# Patient Record
Sex: Female | Born: 1940 | Race: White | Hispanic: No
Health system: Midwestern US, Academic
[De-identification: ages and names within clinical notes are randomized; demographics above are authoritative.]

---

## 2016-12-28 ENCOUNTER — Encounter: Admit: 2016-12-28 | Discharge: 2016-12-28

## 2016-12-28 ENCOUNTER — Ambulatory Visit: Admit: 2016-12-28 | Discharge: 2016-12-29 | Payer: MEDICARE

## 2016-12-28 DIAGNOSIS — E039 Hypothyroidism, unspecified: ICD-10-CM

## 2016-12-28 DIAGNOSIS — J449 Chronic obstructive pulmonary disease, unspecified: Principal | ICD-10-CM

## 2016-12-28 DIAGNOSIS — I10 Essential (primary) hypertension: ICD-10-CM

## 2016-12-28 DIAGNOSIS — I509 Heart failure, unspecified: ICD-10-CM

## 2016-12-28 DIAGNOSIS — E785 Hyperlipidemia, unspecified: ICD-10-CM

## 2016-12-28 MED ORDER — HYDROCHLOROTHIAZIDE 25 MG PO TAB
25 mg | ORAL_TABLET | Freq: Every morning | ORAL | 3 refills | 28.00000 days | Status: AC
Start: 2016-12-28 — End: 2017-10-25

## 2016-12-28 MED ORDER — LISINOPRIL 10 MG PO TAB
ORAL_TABLET | Freq: Every evening | 3 refills | Status: AC
Start: 2016-12-28 — End: 2017-02-22

## 2016-12-28 NOTE — Progress Notes
Date of Service: 12/28/2016    Robin Carr is a 76 y.o. female.       HPI      [name] returns for follow-up of chest pain and hypertension.  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  Since I seen her last she reports one episode requiring nitro.    When I saw her last I had her reduce amlodipine from 5-2.5 mg and added hydrochlorothiazide hoping that her leg edema would resolve with reduction in the amlodipine.  She improved transiently but about a week ago had worsening of edema particularly in the right ankle.  Her diastolic blood pressure is today are little higher than usual but she thought the 150-160 reading that she had today was when she is seen in the past.  She does not check her blood pressure at home and she does not check it very often elsewhere..    When I saw her last time I was concerned about aortic stenosis.  I asked her to have an echo which was completed on March 15.  That echo showed mild aortic stenosis with no pericardial effusion and no other valve disease EF was normal at 65%.         Vitals:    12/28/16 1009 12/28/16 1047   BP: 158/82 162/84   Pulse: 61    Weight: 73 kg (161 lb)    Height: 1.499 m (4' 11)      Body mass index is 32.52 kg/m???.     Past Medical History  Patient Active Problem List    Diagnosis Date Noted   ??? Bilateral leg edema 06/18/2014     Describes a leg DVT scan negative for clots in about March 2015,  [facility]      ??? COPD (chronic obstructive pulmonary disease) (HCC) 05/14/2014   ??? Hypothyroid 05/14/2014   ??? Hyperlipemia 05/14/2014     On mevacor 10 05/20/2014 FLP: total 182, trig 87, HDL 65, LDL 108         ??? Hypertension 05/14/2014   ??? 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   06/2014: regadenoson thallium: EF 86%, normal perfusion 08/2015 Occasional chest pain mostlyw/ physical stress when emotionally stressed, nitro responsive, stable     ??? 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   HENT: Positive for tinnitus.    Eyes: Positive for photophobia.   Cardiovascular: Positive for dyspnea on exertion, leg swelling and orthopnea.   Respiratory: Positive for shortness of breath and sputum production.    Endocrine: Negative.    Hematologic/Lymphatic: Negative.    Skin: Positive for itching.   Musculoskeletal: Positive for arthritis, back pain, gout, joint pain, joint swelling and neck pain.   Gastrointestinal: Negative.    Genitourinary: Positive for bladder incontinence.   Neurological: Positive for excessive daytime sleepiness and vertigo.   Psychiatric/Behavioral: Positive for memory loss.   Allergic/Immunologic: Positive for environmental allergies.  Physical Exam  General: Patient in no distress, looks generally healthy. Skin warm and dry, non icteric,    Mucous membranes moist.  Pupils equal and round  Carotids: no bruits    Thyroid not enlarged.  Neck veins: CVP <6  no V wave, no HJR     Respiratory: Breathing comfortably. Lungs clear to percussion. and auscultation  Cardiac: Regular rhythm. LV impulse not palpable. Normal S1 & S2, Fourth heart sound, no rub or S3.  Fourth heart sound with no rub or third sound .  grade 2/6 mid to late peaking systolic murmur heard parasternal region radiating to the sternal notch and toward the clavicles but not clearly into the neck.  It is also audible at the apex.  There is no diastolic murmur.  Abdomen: soft, non-tender, no masses,bruits,hepatic or aortic enlargement. + bowel sounds. Femoral arteries: Good pulses, no bruits.  Legs/feet: Normal PT pulses,  1-2 mm bilateral ankle and low leg edema slightly greater on the right as was described on last exam from 08/2014.   Motor: Normal muscle strength. Cognitive: Pleasant demeanor. Good insight. No depression     Cardiovascular Studies  Today's 12 lead EKG: sinus rhythm, rate 61 Diffuse T wave flattening     Problems Addressed Today  No diagnosis found.    Assessment and Plan     Mrs. Himes leg swelling is probably due at least in part to the amlodipine as she has no elevation in venous pressure or other findings of volume overload.  I am going to have her stop that drug completely.  Her blood pressures higher than optimal and I am going to start her on lisinopril 10 mg at bedtime and have her increase the hydrochlorothiazide from 12.5-25 mg I will have her start the lisinopril initially at 5 mg bedtime for about a week before having her to increase to the full 10 mg.  I want her to have BMP blood test in about 2 weeks.    I will see her again in about 6-8 weeks for follow-up on her pressure.  She knows to call if things worsen.       NB: This document was prepared within the Epic(TM) EMR using templates developed by the [facility] of [facility]. It can be accessed online through [facility] feature by clinicians at other Epic institutions.  The free text in this document, was generated through Dragon(TM) software.  Editing and proofreading were done by the author of this document Dr. Mable Paris MD, [facility] principally at the point of care.  In spite of the author's best effort to identify every air introduced by voice to text dictation, some errors that may represent misspelling or misstatements of what was dictated may persist.  If there are questions about content in this document please contact Dr. Hale Bogus.    The written information I provided Ms. Bivens at the conclusion of today's encounter is as  follows: Patient Instructions   [name] I think we can improve your blood pressure control and improve your edema by making these medication changes as I am suggesting.  Stop the amlodipine.  I think a 2.5 mg is causing some of the edema but not helping that much with the blood pressure control.    Increase the hydrochlorothiazide to a full tablet 25 mg daily.  At the same time start lisinopril.  I am Unasyn to 10 mg tablet prescription to your pharmacy when she does start with 5 mg at  bedtime for the first week to get used to it and then increase to 10.  Like you to have your blood pressure checked more often summer and and see that it is running under 130/85 most of the time.  Her graph would like you to have a blood chemistry check in 2 weeks.    I like to see her back in 2 months for another blood pressure check.  The call if things are problematic before then.    It's good to see you today.   Marissa Nestle, MD               Current Medications (including today's revisions)  ??? albuterol (PROAIR HFA) 90 mcg/actuation inhaler Inhale 2 Puffs by mouth every 6 hours as needed for Wheezing.   ??? albuterol 0.5% (PROVENTIL; VENTOLIN) 2.5 mg/0.5 mL nebu nebulizer solution Inhale 2.5 mg solution as directed every 6 hours as needed.   ??? allopurinol (ZYLOPRIM) 100 mg tablet Take 100 mg by mouth twice daily.   ??? amLODIPine (NORVASC) 5 mg tablet Take 0.5 tablets by mouth daily.   ??? atorvastatin (LIPITOR) 20 mg tablet TAKE ONE TABLET BY MOUTH ONCE DAILY.   ??? FLUTICASONE/SALMETEROL (ADVAIR DISKUS IN) Inhale  by mouth into the lungs twice daily.   ??? hydroCHLOROthiazide (HYDRODIURIL) 25 mg tablet Take 0.5 tablets by mouth every morning.   ??? IPRATROPIUM/ALBUTEROL SULFATE (COMBIVENT IN) Inhale 1 puff by mouth into the lungs four times daily.   ??? levothyroxine (SYNTHROID) 75 mcg tablet Take 75 mcg by mouth daily.   ??? loratadine (CLARITIN) 10 mg tablet Take 10 mg by mouth daily. ??? meclizine (ANTIVERT) 25 mg tablet Take 25 mg by mouth as Needed.   ??? MULTIVITAMINS WITH FLUORIDE (MULTI-VITAMIN PO) Take  by mouth.   ??? nitroglycerin (NITROSTAT) 0.3 mg tablet Place 1 Tab under tongue every 5 minutes as needed for Chest Pain.   ??? ranitidine(+) (ZANTAC) 150 mg tablet Take 150 mg by mouth at bedtime daily.

## 2017-01-11 ENCOUNTER — Encounter: Admit: 2017-01-11 | Discharge: 2017-01-11 | Payer: MEDICARE

## 2017-01-11 DIAGNOSIS — I10 Essential (primary) hypertension: Principal | ICD-10-CM

## 2017-01-11 LAB — BASIC METABOLIC PANEL
Lab: 0.9
Lab: 103
Lab: 142
Lab: 29
Lab: 10
Lab: 104
Lab: 14
Lab: 15
Lab: 4.7

## 2017-02-22 ENCOUNTER — Ambulatory Visit: Admit: 2017-02-22 | Discharge: 2017-02-23 | Payer: MEDICARE

## 2017-02-22 ENCOUNTER — Encounter: Admit: 2017-02-22 | Discharge: 2017-02-22

## 2017-02-22 DIAGNOSIS — E785 Hyperlipidemia, unspecified: ICD-10-CM

## 2017-02-22 DIAGNOSIS — E039 Hypothyroidism, unspecified: ICD-10-CM

## 2017-02-22 DIAGNOSIS — I10 Essential (primary) hypertension: ICD-10-CM

## 2017-02-22 DIAGNOSIS — J449 Chronic obstructive pulmonary disease, unspecified: Principal | ICD-10-CM

## 2017-02-22 DIAGNOSIS — I509 Heart failure, unspecified: ICD-10-CM

## 2017-02-22 DIAGNOSIS — I208 Other forms of angina pectoris: Principal | ICD-10-CM

## 2017-02-22 MED ORDER — LISINOPRIL 10 MG PO TAB
10 mg | Freq: Every evening | ORAL | 0 refills | Status: AC
Start: 2017-02-22 — End: 2019-02-19

## 2017-02-22 NOTE — Progress Notes
Date of Service: 02/22/2017    Robin Carr is a 76 y.o. female.       HPI  Robin Carr returns for follow-up of her blood pressure and edema and cardiac syndrome X type chest pain about 3 months after I had her stop amlodipine because of edema and increase lisinopril and hydrochlorothiazide for better blood pressure control.  Since I seen her last she has done well she had one episode of chest pain that was associated with overeating and some GI distress.  She has not had any other chest pain with exertion that sounds like syndrome X cardiac type angina.  Her blood pressure log shows blood pressure readings down to around 100 and up into the 140s but no 150s with many readings in the 110-130/85 range.  Her main functional limitation is knee pain.  Sometimes she thinks her leg swelled because of knee inflammation.  She is not having palpitations syncope or near syncope.  Overall things seem stable for her.    Her last lipid profile was less than a year ago and looked fine on atorvastatin   03/12/2016 00:00   Cholesterol 143 (L)   Triglycerides 144   HDL 49   LDL 78     Her lab work last month looked good with no diuretic related  hypokalemia.   01/11/2017 00:00   Sodium 142   Potassium 4.7   Chloride 104   CO2 29.0   Anion Gap 14   Blood Urea Nitrogen 15.0   Creatinine 0.96   Glucose 103   Calcium 10.0                 Vitals:    02/22/17 0912 02/22/17 0928   BP: 140/86 134/76   Pulse: 58    Weight: 70.8 kg (156 lb)    Height: 1.499 m (4' 11)      Body mass index is 31.51 kg/m???.     Past Medical History  Patient Active Problem List    Diagnosis Date Noted   ??? Bilateral leg edema 06/18/2014     Describes a leg DVT scan negative for clots in about March 2015,  [facility]      ??? COPD (chronic obstructive pulmonary disease) (HCC) 05/14/2014   ??? Hypothyroid 05/14/2014   ??? Hyperlipemia 05/14/2014     On mevacor 10 05/20/2014 FLP: total 182, trig 87, HDL 65, LDL 108         ??? Hypertension 05/14/2014 ??? 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   06/2014: regadenoson thallium: EF 86%, normal perfusion  08/2015 Occasional chest pain mostlyw/ physical stress when emotionally stressed, nitro responsive, stable     ??? 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   HENT: Positive for congestion and tinnitus.    Eyes: Positive for photophobia.   Cardiovascular: Positive for chest pain, dyspnea on exertion, leg swelling and orthopnea.   Respiratory: Positive for shortness of breath and sputum production.    Endocrine: Negative.  Hematologic/Lymphatic: Negative.    Skin: Negative.    Musculoskeletal: Positive for arthritis, back pain, gout, joint pain, joint swelling, muscle cramps, neck pain and stiffness.   Gastrointestinal: Positive for heartburn.   Genitourinary: Positive for bladder incontinence and urgency.   Neurological: Positive for excessive daytime sleepiness, focal weakness, headaches, paresthesias and vertigo.   Psychiatric/Behavioral: Positive for memory loss.   Allergic/Immunologic: Positive for environmental allergies.   14 organ system review noted. It is negative except as reported in current narrative or above in the ROS section. This is a patient centered review of systems that was stated by the patient in her terms prior to my personal problem oriented interview with the patient     Physical Exam  General: Patient in no distress, looks generally healthy. Skin warm and dry, non icteric,    Mucous membranes moist.  Pupils equal and round  Carotids: no bruits Thyroid not enlarged.  Neck veins: CVP <6  no V wave, no HJR     Respiratory: Breathing comfortably. Lungs clear to percussion. and auscultation  Cardiac: Regular rhythm. LV impulse not palpable. Normal S1 & S2, Fourth heart sound, no rub or S3.  Fourth heart sound with no rub or third sound .  grade 2/6 mid to late peaking systolic murmur heard parasternal region radiating to the sternal notch and toward the clavicles but not clearly into the neck.  It is also audible at the apex.  There is no diastolic murmur.  Abdomen: soft, non-tender, no masses,bruits,hepatic or aortic enlargement. + bowel sounds.   Femoral arteries: Good pulses, no bruits.  Legs/feet: Normal PT pulses,  1-2 mm bilateral ankle and low leg edema slightly greater on the right   Motor: Normal muscle strength. Cognitive: Pleasant demeanor. Good insight. No depression     Cardiovascular Studies  EKG today shows sinus rhythm leftward axis minor ST-T changes with 1 mm T-wave inversions leads V2 through V4 and lead III.Marland Kitchen    Problems Addressed Today  No diagnosis found.    Assessment and Plan     Overall Mrs. Robin Carr is doing well.  I see no reason to change any of her minute any of her medications.  She has a little edema on hydrochlorothiazide but I do not think this is a reason to modify her medications.  Her edema is not bothering her    She has lost a little weight.  Her blood pressure looks fine with a 20 point reduction in reading in the office compared to her last visit.  I do not think it is prudent to try to augment her medications from here given the fact that she has a low range blood pressure approaching 100.    Her murmur of aortic outflow obstruction is consistent only with mild to moderate aortic stenosis/sclerosis.  There is no indication for a follow-up echo at this time.  I think I can see her again in a year sooner if the need arises.      NB: This document was prepared within the Epic(TM) EMR using templates developed by the [facility] of [facility]. It can be accessed online through [facility] feature by clinicians at other Epic institutions.  The free text in this document, was generated through Dragon(TM) software.  Editing and proofreading were done by the author of this document Dr. Mable Paris MD, [facility] principally at the point of care.  In spite of the author's best effort to identify every error introduced by voice to  text dictation, some errors that may represent misspelling or misstatements of what was dictated may persist.  If there are questions about content in this document please contact Dr. Hale Bogus.    The written information I provided Ms. Tardiff at the conclusion of today's encounter is as  follows:    Patient Instructions   Things look much better today than they did last visit when I had to change  your blood pressure medications.  Your blood pressure is 20 points less in clinic today than on last visit.  Your readings at home look good.  I do not think we can increase the medication safely to reduce the slightly elevated readings because you might drop too low at other times and be hurt.    Call if you get more chest pain.  Right now I am very pleased to see that you had only one episode in the past 3 months.    I really do not think I need to see you again for a year unless something changes.  It would be okay with me if you returned at 6 months but that is going to be in February which is probably the worst time of year to be finding her way to the office.  If you want to a scheduled for a little later in the spring and still see me sooner than a year that would be fine also.    Call if you develop chest pain or pressure with exercise or unexplained shortness of air when you are active.   It's good to see you today.   Marissa Nestle, MD                Current Medications (including today's revisions)  ??? albuterol (PROAIR HFA) 90 mcg/actuation inhaler Inhale 2 Puffs by mouth every 6 hours as needed for Wheezing.   ??? albuterol 0.5% (PROVENTIL; VENTOLIN) 2.5 mg/0.5 mL nebu nebulizer solution Inhale 2.5 mg solution as directed every 6 hours as needed.   ??? allopurinol (ZYLOPRIM) 100 mg tablet Take 100 mg by mouth twice daily.   ??? atorvastatin (LIPITOR) 20 mg tablet TAKE ONE TABLET BY MOUTH ONCE DAILY.   ??? FLUTICASONE/SALMETEROL (ADVAIR DISKUS IN) Inhale  by mouth into the lungs twice daily.   ??? hydroCHLOROthiazide (HYDRODIURIL) 25 mg tablet Take 1 tablet by mouth every morning.   ??? levothyroxine (SYNTHROID) 75 mcg tablet Take 75 mcg by mouth daily.   ??? lisinopril (PRINIVIL; ZESTRIL) 10 mg tablet Take 1 tab HS after one week at 1/2 tab bedtime (Patient taking differently: Take 10 mg by mouth at bedtime daily. Take 1 tab HS)   ??? loratadine (CLARITIN) 10 mg tablet Take 10 mg by mouth daily.   ??? meclizine (ANTIVERT) 25 mg tablet Take 25 mg by mouth as Needed.   ??? MULTIVITAMINS WITH FLUORIDE (MULTI-VITAMIN PO) Take  by mouth.   ??? nitroglycerin (NITROSTAT) 0.3 mg tablet Place 1 Tab under tongue every 5 minutes as needed for Chest Pain.   ??? ranitidine(+) (ZANTAC) 150 mg tablet Take 150 mg by mouth at bedtime daily.

## 2017-06-19 ENCOUNTER — Encounter: Admit: 2017-06-19 | Discharge: 2017-06-19 | Payer: MEDICARE

## 2017-06-19 DIAGNOSIS — I10 Essential (primary) hypertension: Principal | ICD-10-CM

## 2017-06-19 DIAGNOSIS — E785 Hyperlipidemia, unspecified: ICD-10-CM

## 2017-06-20 MED ORDER — ATORVASTATIN 20 MG PO TAB
ORAL_TABLET | Freq: Every day | 3 refills | Status: AC
Start: 2017-06-20 — End: 2018-06-20

## 2017-08-10 LAB — COMPREHENSIVE METABOLIC PANEL
Lab: 1
Lab: 1.1
Lab: 105
Lab: 143
Lab: 17 — ABNORMAL HIGH (ref 0–14)
Lab: 35 — ABNORMAL HIGH (ref 5–34)
Lab: 4
Lab: 40
Lab: 5.1
Lab: 7.6
Lab: 82

## 2017-08-10 LAB — LIPID PROFILE
Lab: 147
Lab: 53
Lab: 73

## 2017-08-11 ENCOUNTER — Encounter: Admit: 2017-08-11 | Discharge: 2017-08-11 | Payer: MEDICARE

## 2017-08-11 DIAGNOSIS — I10 Essential (primary) hypertension: Principal | ICD-10-CM

## 2017-08-11 DIAGNOSIS — E785 Hyperlipidemia, unspecified: ICD-10-CM

## 2017-10-25 ENCOUNTER — Ambulatory Visit: Admit: 2017-10-25 | Discharge: 2017-10-26 | Payer: MEDICARE

## 2017-10-25 ENCOUNTER — Encounter: Admit: 2017-10-25 | Discharge: 2017-10-25 | Payer: Medicare Other

## 2017-10-25 DIAGNOSIS — I10 Essential (primary) hypertension: ICD-10-CM

## 2017-10-25 DIAGNOSIS — I208 Other forms of angina pectoris: ICD-10-CM

## 2017-10-25 DIAGNOSIS — I358 Other nonrheumatic aortic valve disorders: ICD-10-CM

## 2017-10-25 DIAGNOSIS — K635 Polyp of colon: ICD-10-CM

## 2017-10-25 DIAGNOSIS — E785 Hyperlipidemia, unspecified: ICD-10-CM

## 2017-10-25 DIAGNOSIS — E039 Hypothyroidism, unspecified: ICD-10-CM

## 2017-10-25 DIAGNOSIS — I509 Heart failure, unspecified: ICD-10-CM

## 2017-10-25 DIAGNOSIS — R600 Localized edema: Principal | ICD-10-CM

## 2017-10-25 DIAGNOSIS — E875 Hyperkalemia: ICD-10-CM

## 2017-10-25 DIAGNOSIS — E7800 Pure hypercholesterolemia, unspecified: ICD-10-CM

## 2017-10-25 DIAGNOSIS — J449 Chronic obstructive pulmonary disease, unspecified: Principal | ICD-10-CM

## 2017-10-25 MED ORDER — CHLORTHALIDONE 25 MG PO TAB
25 mg | ORAL_TABLET | Freq: Every day | ORAL | 3 refills | Status: AC
Start: 2017-10-25 — End: 2019-03-26

## 2017-12-23 ENCOUNTER — Encounter: Admit: 2017-12-23 | Discharge: 2017-12-23 | Payer: MEDICARE

## 2017-12-26 MED ORDER — LISINOPRIL 10 MG PO TAB
10 mg | ORAL_TABLET | Freq: Every evening | ORAL | 3 refills | Status: AC
Start: 2017-12-26 — End: 2018-12-18

## 2017-12-30 LAB — BASIC METABOLIC PANEL
Lab: 0.9
Lab: 14
Lab: 15
Lab: 58 — ABNORMAL LOW (ref >59)
Lab: 9.8
Lab: 99

## 2018-03-13 IMAGING — MG MAMMOGRAM 3D SCREEN, BILATERAL
10 of 15 series · 10 of 15 positions shown · non-contrast
Comparison: none

[R CC (1 of 2)]
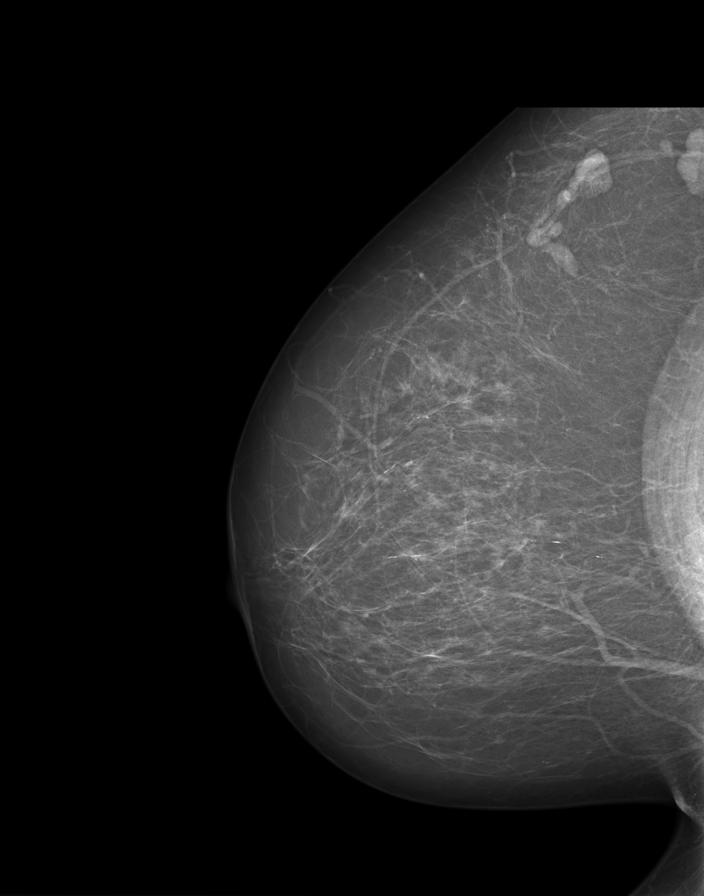

[R tomo (1 of 2)]
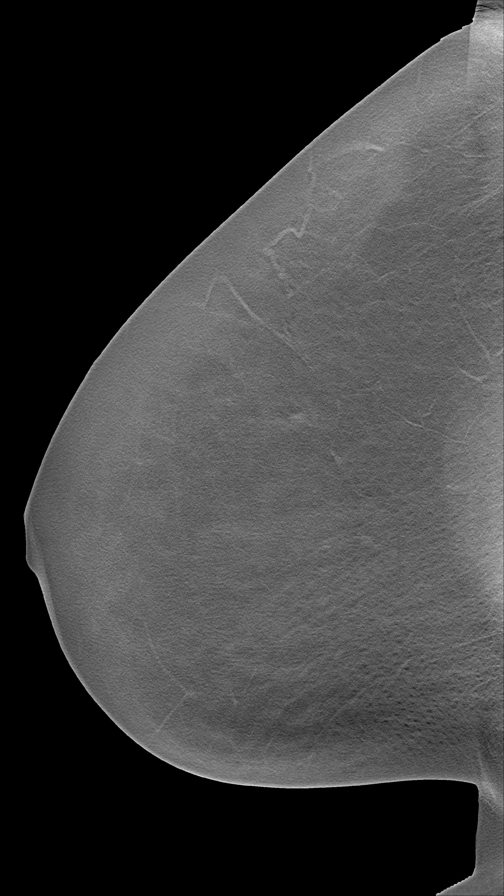

[R CC (2 of 2)]
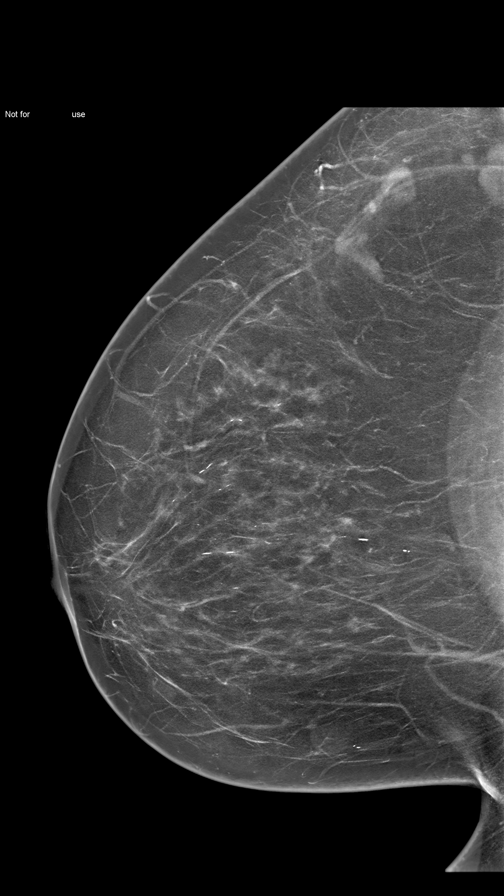

[R (1 of 2)]
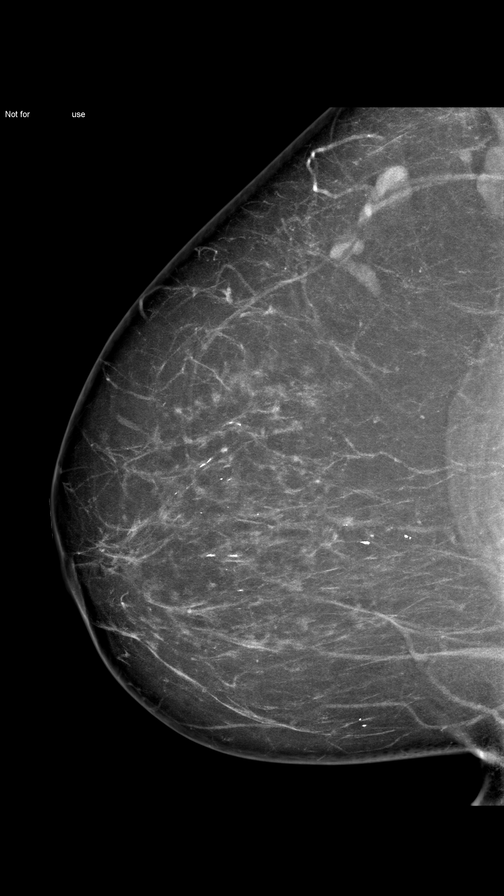

[L tomo]
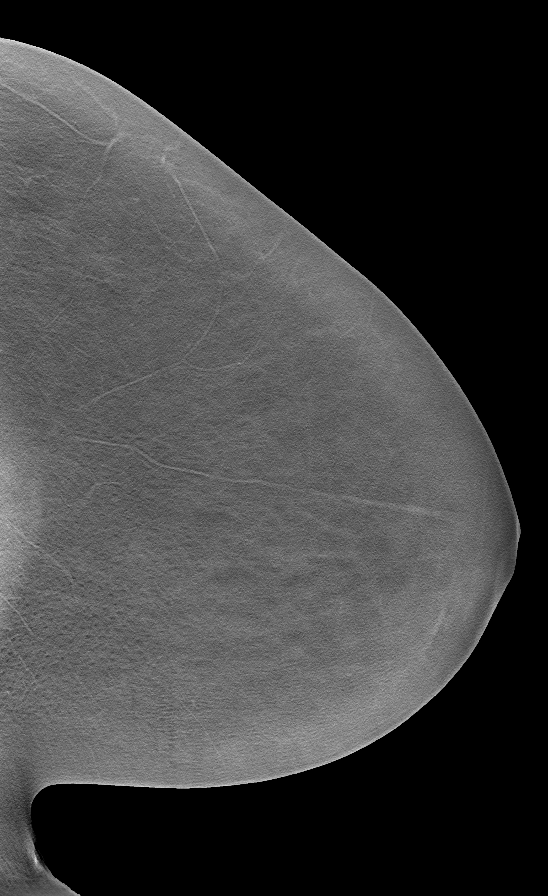

[L]
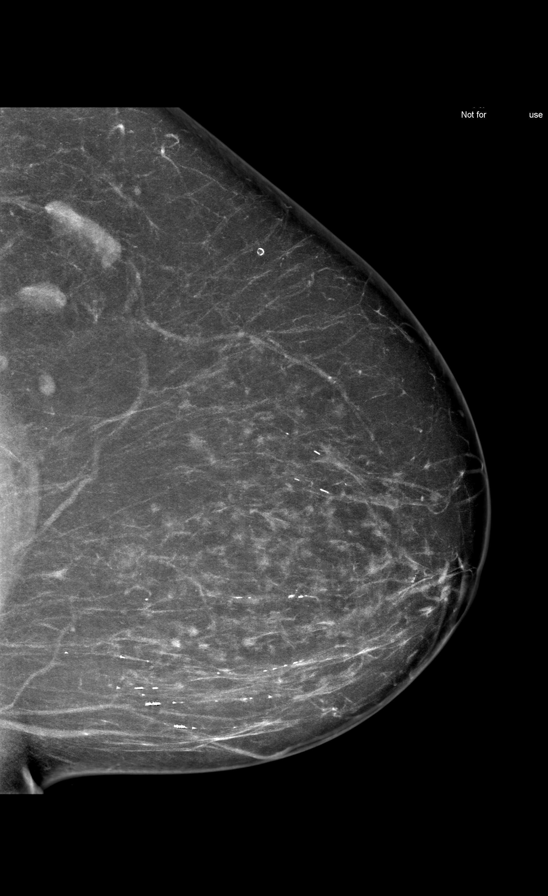

[R MLO (1 of 2)]
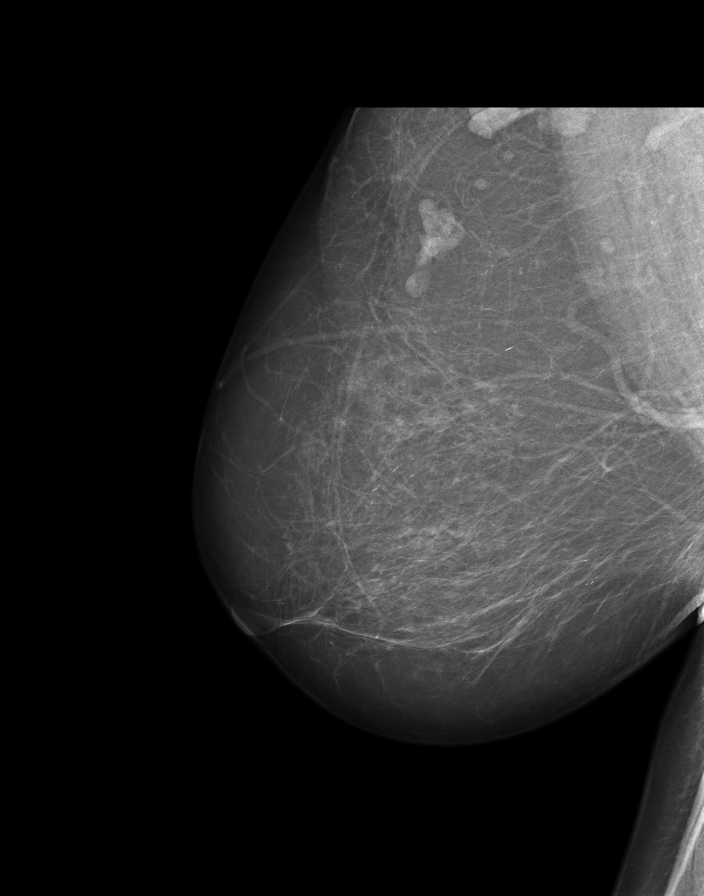

[R tomo (2 of 2)]
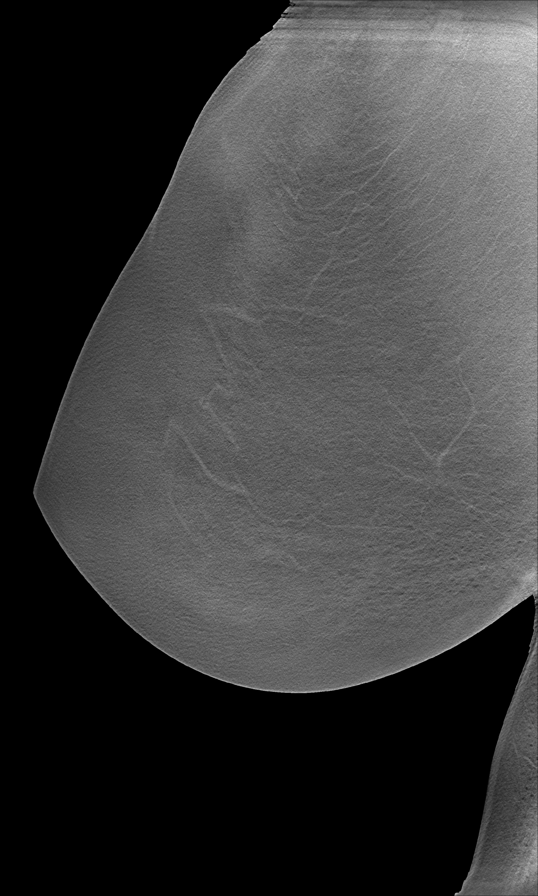

[R MLO (2 of 2)]
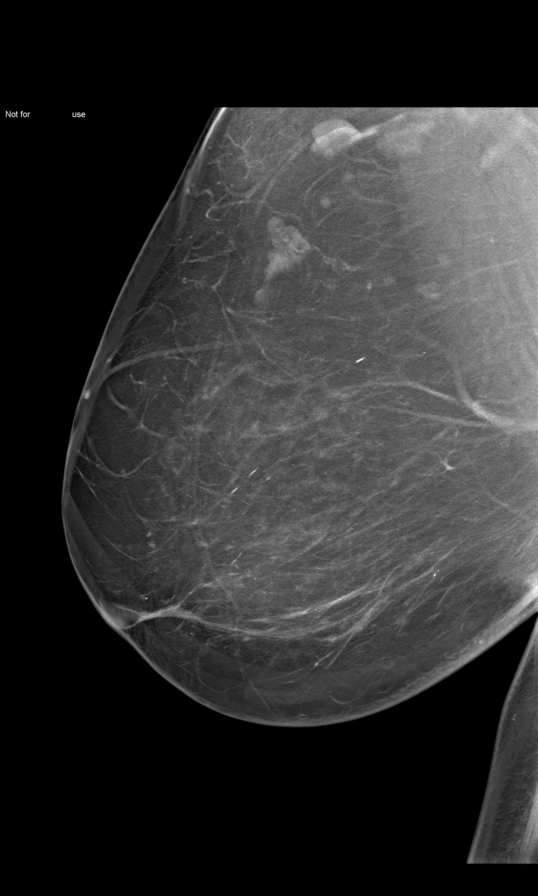

[R (2 of 2)]
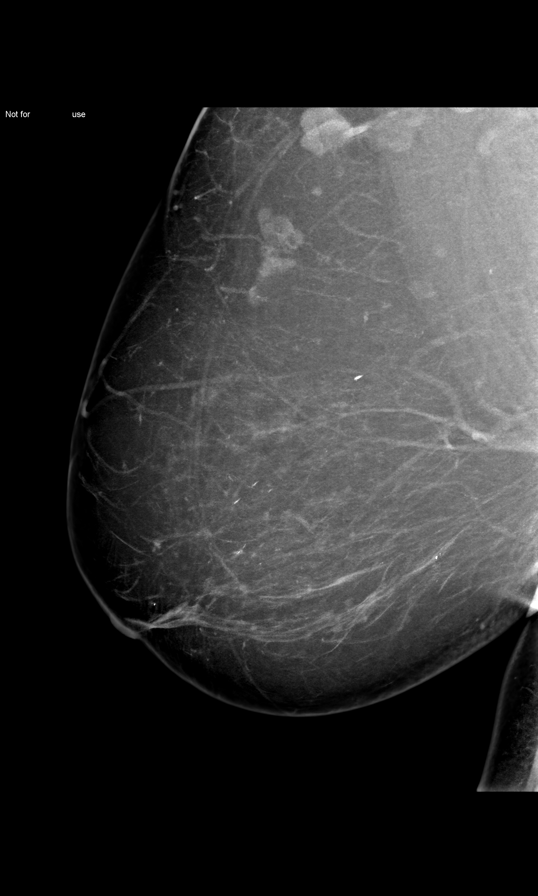

[10 of 15 positions shown; findings below may reference images not displayed]

DIAGNOSTIC STUDIES

EXAM

Screening mammogram

INDICATION

screening bilateral
SCREENING. AB (3D) PRIORS: 9112- FILMS ARE ON SITE.

TECHNIQUE

Digital craniocaudal and mediolateral oblique images of the bilateral breasts were obtained and
reviewed with computer-aided detection.

COMPARISONS

Mammograms dated October 26, 2002

FINDINGS

The breasts demonstrate a scattered fibroglandular pattern. There are normal secretory
calcifications again appreciated. There are scattered axillary lymph nodes which are unchanged
bilaterally. There is no new suspicious calcification, architectural distortion or dominant mass.
There is no nipple retraction or skin thickening.

IMPRESSION

BI-RADS 2 benign findings

Yearly screening mammogram is recommended and a follow-up letter will be scheduled

## 2018-03-28 ENCOUNTER — Encounter: Admit: 2018-03-28 | Discharge: 2018-03-28 | Payer: MEDICARE

## 2018-04-11 ENCOUNTER — Ambulatory Visit: Admit: 2018-04-11 | Discharge: 2018-04-12 | Payer: MEDICARE

## 2018-04-11 ENCOUNTER — Encounter: Admit: 2018-04-11 | Discharge: 2018-04-11 | Payer: MEDICARE

## 2018-04-11 DIAGNOSIS — R600 Localized edema: ICD-10-CM

## 2018-04-11 DIAGNOSIS — J449 Chronic obstructive pulmonary disease, unspecified: Principal | ICD-10-CM

## 2018-04-11 DIAGNOSIS — E039 Hypothyroidism, unspecified: ICD-10-CM

## 2018-04-11 DIAGNOSIS — E785 Hyperlipidemia, unspecified: ICD-10-CM

## 2018-04-11 DIAGNOSIS — E7800 Pure hypercholesterolemia, unspecified: ICD-10-CM

## 2018-04-11 DIAGNOSIS — I10 Essential (primary) hypertension: ICD-10-CM

## 2018-04-11 DIAGNOSIS — I509 Heart failure, unspecified: ICD-10-CM

## 2018-04-11 DIAGNOSIS — I208 Other forms of angina pectoris: Principal | ICD-10-CM

## 2018-04-11 DIAGNOSIS — I358 Other nonrheumatic aortic valve disorders: ICD-10-CM

## 2018-06-20 ENCOUNTER — Encounter: Admit: 2018-06-20 | Discharge: 2018-06-20 | Payer: MEDICARE

## 2018-06-20 MED ORDER — ATORVASTATIN 20 MG PO TAB
ORAL_TABLET | Freq: Every day | 0 refills | Status: AC
Start: 2018-06-20 — End: 2018-09-20

## 2018-09-17 ENCOUNTER — Encounter: Admit: 2018-09-17 | Discharge: 2018-09-17 | Payer: Medicare Other

## 2018-09-20 MED ORDER — ATORVASTATIN 20 MG PO TAB
ORAL_TABLET | Freq: Every day | 3 refills | Status: AC
Start: 2018-09-20 — End: 2019-09-10

## 2018-11-13 IMAGING — CR CHEST
1 series · 1 of 1 positions shown · non-contrast
Comparison: none

[chest port x-wise]
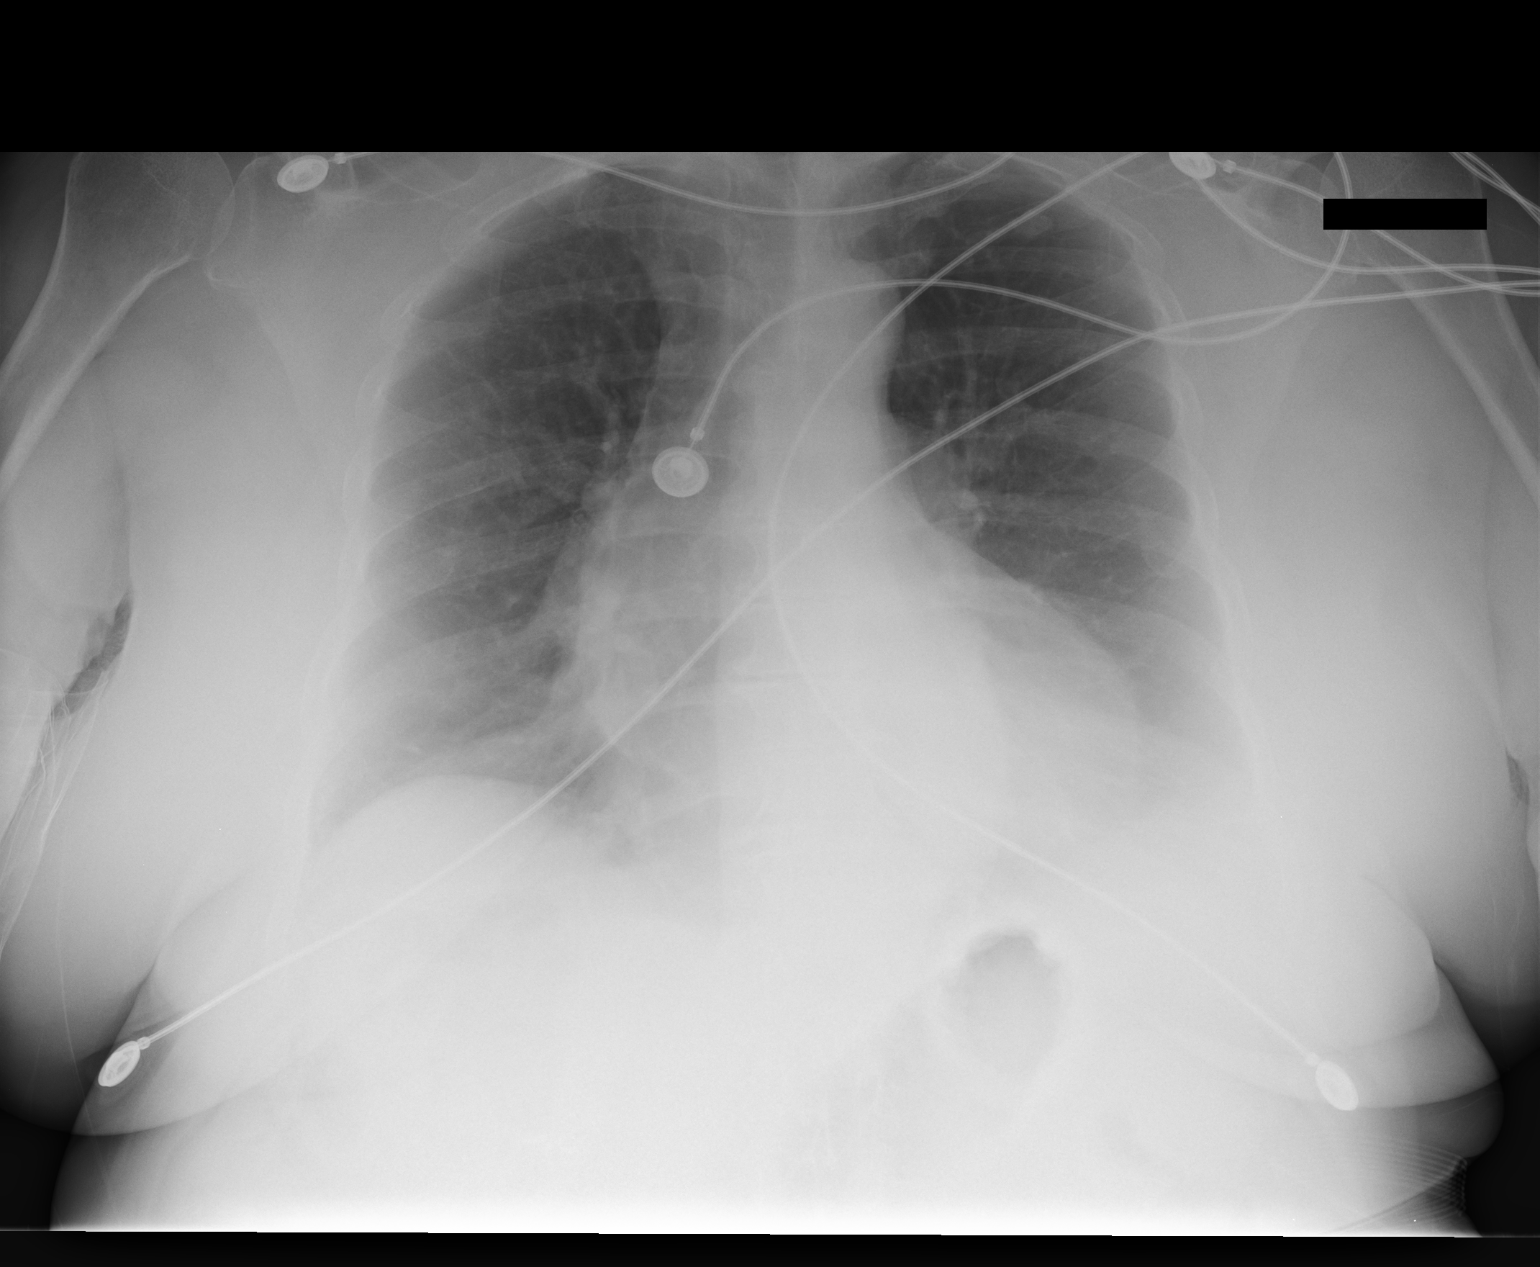

[1 of 1 positions shown; findings below may reference images not displayed]

DIAGNOSTIC STUDIES

EXAM

Chest radiograph.

INDICATION

cp
PT C/O CHEST PAIN. SOA. TJ

TECHNIQUE

AP chest.

COMPARISONS

June 06, 2017.

FINDINGS

The cardiomediastinal silhouette is not significantly changed. Mildly thickened interstitial
markings. There is no compelling evidence of dense consolidation, effusion, or pneumothorax. Mild
pulmonary hyperinflation.

IMPRESSION

No compelling evidence of significant change from prior exam.

Tech Notes:

PT C/O CHEST PAIN. SOA. TJ

## 2018-12-18 ENCOUNTER — Encounter: Admit: 2018-12-18 | Discharge: 2018-12-18

## 2018-12-18 MED ORDER — LISINOPRIL 10 MG PO TAB
ORAL_TABLET | Freq: Every day | 0 refills | Status: DC
Start: 2018-12-18 — End: 2019-02-19

## 2019-02-19 ENCOUNTER — Encounter: Admit: 2019-02-19 | Discharge: 2019-02-19 | Payer: MEDICARE

## 2019-02-19 MED ORDER — LISINOPRIL 10 MG PO TAB
ORAL_TABLET | Freq: Every day | 0 refills | Status: DC
Start: 2019-02-19 — End: 2019-06-18

## 2019-03-24 ENCOUNTER — Encounter: Admit: 2019-03-24 | Discharge: 2019-03-24 | Payer: MEDICARE

## 2019-03-26 MED ORDER — CHLORTHALIDONE 25 MG PO TAB
ORAL_TABLET | Freq: Every day | ORAL | 1 refills | 90.00000 days | Status: DC
Start: 2019-03-26 — End: 2019-09-17

## 2019-04-26 ENCOUNTER — Encounter: Admit: 2019-04-26 | Discharge: 2019-04-26 | Payer: MEDICARE

## 2019-05-01 ENCOUNTER — Encounter: Admit: 2019-05-01 | Discharge: 2019-05-01 | Payer: MEDICARE

## 2019-05-01 DIAGNOSIS — J449 Chronic obstructive pulmonary disease, unspecified: Secondary | ICD-10-CM

## 2019-05-01 DIAGNOSIS — E785 Hyperlipidemia, unspecified: Secondary | ICD-10-CM

## 2019-05-01 DIAGNOSIS — I10 Essential (primary) hypertension: Secondary | ICD-10-CM

## 2019-05-01 DIAGNOSIS — I358 Other nonrheumatic aortic valve disorders: Secondary | ICD-10-CM

## 2019-05-01 DIAGNOSIS — E7800 Pure hypercholesterolemia, unspecified: Secondary | ICD-10-CM

## 2019-05-01 DIAGNOSIS — E039 Hypothyroidism, unspecified: Secondary | ICD-10-CM

## 2019-05-01 DIAGNOSIS — I509 Heart failure, unspecified: Secondary | ICD-10-CM

## 2019-05-01 DIAGNOSIS — I208 Other forms of angina pectoris: Secondary | ICD-10-CM

## 2019-05-01 NOTE — Progress Notes
Date of Service: 05/01/2019    Robin Carr is a 78 y.o. female.       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  She now tells me that her limiting factor in activity is knee pain.  She is not having any exertional chest pain.  In fact  denies having typical symptoms suggesting the presence of angina, heart failure, TIA, claudication or arrhythmias.      Her last ER visit was early last year when she had atypical chest pain tghat was troponin negative. She has occasional chest pain but nothing progressive or limiting her activities of daily living.     She has had knee pain to the point where she has been considering knee replacement.  Her pulmonologist Dr. Lucilla Lame has advised her against knee replacement if at all possible because of pulmonary risks.    When I inquired about  her tolerance to atorvastatin she states that she has some joint stiffness that is worse in the morning and diminishes as the day goes by.  I reassured her that this is not characteristic of statin discomfort.    Blood pressure when checked looks good.  She is not checking it at home.  Today's readings are very satisfactory.         Vitals:    05/01/19 0912 05/01/19 0918   BP: 128/76 132/78   BP Source: Arm, Left Upper Arm, Right Upper   Pulse: 74    Temp: 37.1 ?C (98.8 ?F)    SpO2: 96%    Weight: 71.7 kg (158 lb)    Height: 1.473 m (4' 10)    PainSc: Zero      Body mass index is 33.02 kg/m?Marland Kitchen     Past Medical History  Patient Active Problem List    Diagnosis Date Noted   ? 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 Hyperkalemia 10/25/2017     K 5.1 on HCTZ, switch to Chlorthalidone 25 10/25/17     ? Bilateral leg edema 06/18/2014     Describes a leg DVT scan negative for clots in about March 2015, [facility]   2018 Edema improves with DC amlodipine       ? COPD (chronic obstructive pulmonary disease) (HCC) 05/14/2014   ? Hypothyroid 05/14/2014   ? Hyperlipemia 05/14/2014     On mevacor 10 05/20/2014 FLP: total 182, trig 87, HDL 65, LDL 108    08/10/2017 total cholesterol 154, triglyceride 147, HDL 53, LDL 73 on Lipitor 20 without myalgias         ? Hypertension 05/14/2014   ? 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   06/2014: regadenoson thallium: EF 86%, normal perfusion  08/2015 Occasional chest pain mostlyw/ physical stress when emotionally stressed, nitro responsive, stable  10/2016 off amlodipine due to edema patient having no exertional chest pain with nitroglycerin at home but not used recently.     ? 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   HENT: Negative.    Eyes: Negative.    Cardiovascular: Negative.    Respiratory: Negative.    Endocrine: Negative.    Hematologic/Lymphatic: Negative.    Skin: Negative.    Musculoskeletal: Positive for joint pain and joint swelling.   Gastrointestinal: Negative.    Genitourinary: Positive for bladder incontinence.   Neurological: Positive for vertigo.   Psychiatric/Behavioral: Negative.    Allergic/Immunologic: Negative.        Physical Exam  General: Patient in no distress, looks generally healthy. Skin warm and dry, non icteric, ?  Mucous membranes moist. ?Pupils equal and round ?Carotids: no bruits ???Thyroid not enlarged.  Neck veins: CVP <6 ?no V wave, no HJR ??  Respiratory: Breathing comfortably. Lungs clear to percussion. and auscultation  Cardiac: Regular rhythm. LV impulse not palpable. Normal S1 &?S2, Fourth heart sound, no rub or S3. ?Fourth heart sound with no rub or third sound . ?Grade 2/6 mid to late peaking systolic murmur heard parasternal region  with no diastolic murmur.  Abdomen: soft, non-tender, no masses,bruits,hepatic or aortic enlargement. + bowel sounds.   Femoral arteries: Good pulses, no bruits. ?Legs/feet: Normal PT pulses, ?2-3 mm bilateral ankle and low to mid leg edema slightly greater on the right   Motor: Normal muscle strength. Cognitive: Pleasant demeanor. Good insight. No depression     Cardiovascular Studies  Today's 12 lead EKG: sinus rhythm, rate 76.  Slight T wave inversion seen on EKGs in 2018 has resolved.  No change in tracing from last year.  Her lab work from this summer looks good with a normal renal function and potassium and minimally out of range sodium with LDL under 70 and HDL nearly 50.  The TSH is normal as well.   01/24/2019 00:00   Sodium 134 (L)   Potassium 4.3   Chloride 100   CO2 26.0   Anion Gap 12   Blood Urea Nitrogen 11.0   Creatinine 1.01   eGFR Non African American 56.6 (L)   Glucose 102   Albumin 3.9   Calcium 9.9   Total Bilirubin 0.95   Total Protein 7.0   AST (SGOT) 28   ALT (SGPT) 37   Alk Phosphatase 78   TSH 3.02   Cholesterol 137   Triglycerides 98   HDL 48   LDL 69     Problems Addressed Today  No diagnosis found.    Assessment and Plan     Robin Carr is doing extremely well.  She is not having any chest pain that suggests coronary etiology.  Her musculoskeletal complaints are unlikely the cause of the stiffness in the morning rather than that being from statins.  Blood pressure looks good.  Her LDL is under 70 on atorvastatin.  There is no reason to consider changing any of her cardiac medications.    I endorsed Dr. Lorrin Mais recommendation for her to start low-dose aspirin his primary prophylaxis against cardiovascular events.  She is never had any vascular complications that would call for aspirin but I think at her age with her overall risk factors that aspirin is prudent.  She is doing well without bleeding complications.    Her lab echo showed aortic sclerosis.  Her exam is unchanged.  I do not think we need to repeat an echo Doppler at  any specific time in the future but  I will consider it when I see her again next year.    I do not think I need to see her more often than annually.      NB: The free text in this document was generated through Dragon(TM) software with editing and proofreading  done by the author of this document Dr. Mable Paris MD, [facility] principally at the point of care. Some errors may persist.  If there are questions about content in this document please contact Dr. Hale Bogus.    The written information I provided Robin Carr at the conclusion of today's encounter is as  follows:    Patient Instructions   Overall things are going very well for you.  Your blood pressure looks good.  Your rhythm is regular on EKG.  Your heart murmur is consistent with a minor thickening of the aortic valve without major blockage.  That is what we saw on the echo Doppler.  I do not think you need another echo at any specific time in the future.  I had like to see you again next year and we can think about getting an echo at that time if anything is changed.    In the meantime if you get more chest pain or shortness of breath with exertion let us know and we will reevaluate that at the time.  Your medications for cholesterol reduction are successful which is good news.  Your atorvastatin dose is only 20 mg which is one fourth the maximum.    You don't need a stress test without symptoms. If you start having shortness of air or chest discomfort with exercise, call in so we can decide about a stress test or other evaluation.    Return to see me for a recheck in one year.   I can see you sooner if needed.   Call in if you have problems or questions. Marissa Nestle, MD                Current Medications (including today's revisions)  ? aclidinium bromide(+) (TUDORZA PRESSAIR) 400 mcg/actuation inhaler   1 Puff(s), BID, INH, 3 EA, 340b plan  replaces spiriva, 3 Number of Refills, 3, Route to Pharmacy Electronically, [facility]   ? albuterol (PROAIR HFA) 90 mcg/actuation inhaler Inhale 2 Puffs by mouth every 6 hours as needed for Wheezing.   ? albuterol 0.5% (PROVENTIL; VENTOLIN) 2.5 mg/0.5 mL nebu nebulizer solution Inhale 2.5 mg solution as directed every 6 hours as needed.   ? allopurinol (ZYLOPRIM) 100 mg tablet Take 100 mg by mouth twice daily.   ? aspirin EC 81 mg tablet Take 81 mg by mouth daily. Take with food.   ? atorvastatin (LIPITOR) 20 mg tablet Take 1 tablet by mouth once daily   ? calcium carbonate/vitamin D2 (CALCIUM + VITAMIN D PO) Take  by mouth.   ? chlorthalidone (HYGROTON) 25 mg tablet Take 1 tablet by mouth once daily (Patient taking differently: Take 12.5 mg by mouth daily.)   ? Cholecalciferol (Vitamin D3) 2,000 unit cap Take 1 tablet by mouth daily.   ? FLUTICASONE/SALMETEROL (ADVAIR DISKUS IN) Inhale  by mouth into the lungs twice daily.   ? levothyroxine (SYNTHROID) 75 mcg tablet Take 75 mcg by mouth daily.   ? lisinopriL (ZESTRIL) 10 mg tablet TAKE 1 TABLET BY MOUTH ONCE DAILY AT BEDTIME   ? loratadine (CLARITIN) 10 mg tablet Take 10 mg by mouth daily.   ?  meclizine (ANTIVERT) 25 mg tablet Take 25 mg by mouth as Needed.   ? MULTIVITAMINS WITH FLUORIDE (MULTI-VITAMIN PO) Take  by mouth.   ? nitroglycerin (NITROSTAT) 0.3 mg tablet Place 1 Tab under tongue every 5 minutes as needed for Chest Pain.   ? omeprazole DR (PRILOSEC) 40 mg capsule Take 40 mg by mouth daily before breakfast.   ? ranitidine(+) (ZANTAC) 150 mg tablet Take 150 mg by mouth at bedtime daily.   ? tiotropium (SPIRIVA WITH HANDIHALER) 18 mcg capsule for inhaler Place 18 mcg into inhaler and inhale into lungs as directed daily.

## 2019-05-01 NOTE — Patient Instructions
Overall things are going very well for you.  Your blood pressure looks good.  Your rhythm is regular on EKG.  Your heart murmur is consistent with a minor thickening of the aortic valve without major blockage.  That is what we saw on the echo Doppler.  I do not think you need another echo at any specific time in the future.  I had like to see you again next year and we can think about getting an echo at that time if anything is changed.    In the meantime if you get more chest pain or shortness of breath with exertion let us know and we will reevaluate that at the time.  Your medications for cholesterol reduction are successful which is good news.  Your atorvastatin dose is only 20 mg which is one fourth the maximum.    You don't need a stress test without symptoms. If you start having shortness of air or chest discomfort with exercise, call in so we can decide about a stress test or other evaluation.    Return to see me for a recheck in one year.   I can see you sooner if needed.   Call in if you have problems or questions.   Lenoard Aden, MD

## 2019-06-18 ENCOUNTER — Encounter: Admit: 2019-06-18 | Discharge: 2019-06-18 | Payer: MEDICARE

## 2019-06-18 MED ORDER — LISINOPRIL 10 MG PO TAB
ORAL_TABLET | Freq: Every day | 3 refills | Status: AC
Start: 2019-06-18 — End: ?

## 2019-09-07 ENCOUNTER — Encounter: Admit: 2019-09-07 | Discharge: 2019-09-07 | Payer: MEDICARE

## 2019-09-10 MED ORDER — ATORVASTATIN 20 MG PO TAB
ORAL_TABLET | Freq: Every day | 0 refills | Status: DC
Start: 2019-09-10 — End: 2019-12-05

## 2019-09-16 ENCOUNTER — Encounter: Admit: 2019-09-16 | Discharge: 2019-09-16 | Payer: MEDICARE

## 2019-09-17 MED ORDER — CHLORTHALIDONE 25 MG PO TAB
ORAL_TABLET | Freq: Every day | ORAL | 3 refills | 90.00000 days | Status: AC
Start: 2019-09-17 — End: ?

## 2019-12-05 ENCOUNTER — Encounter: Admit: 2019-12-05 | Discharge: 2019-12-05 | Payer: MEDICARE

## 2019-12-05 DIAGNOSIS — E7800 Pure hypercholesterolemia, unspecified: Secondary | ICD-10-CM

## 2019-12-05 MED ORDER — ATORVASTATIN 20 MG PO TAB
ORAL_TABLET | Freq: Every day | 0 refills | Status: AC
Start: 2019-12-05 — End: ?

## 2019-12-12 ENCOUNTER — Encounter: Admit: 2019-12-12 | Discharge: 2019-12-12 | Payer: MEDICARE

## 2019-12-12 DIAGNOSIS — E7800 Pure hypercholesterolemia, unspecified: Secondary | ICD-10-CM

## 2019-12-12 LAB — LIPID PROFILE
Lab: 132
Lab: 158
Lab: 86

## 2020-03-01 ENCOUNTER — Encounter: Admit: 2020-03-01 | Discharge: 2020-03-01 | Payer: MEDICARE

## 2020-03-01 MED ORDER — ATORVASTATIN 20 MG PO TAB
ORAL_TABLET | Freq: Every day | 0 refills
Start: 2020-03-01 — End: ?

## 2020-05-16 ENCOUNTER — Encounter: Admit: 2020-05-16 | Discharge: 2020-05-16 | Payer: MEDICARE

## 2020-05-27 ENCOUNTER — Encounter: Admit: 2020-05-27 | Discharge: 2020-05-27 | Payer: MEDICARE

## 2020-05-27 DIAGNOSIS — E785 Hyperlipidemia, unspecified: Secondary | ICD-10-CM

## 2020-05-27 DIAGNOSIS — I10 Essential (primary) hypertension: Secondary | ICD-10-CM

## 2020-05-27 DIAGNOSIS — I509 Heart failure, unspecified: Secondary | ICD-10-CM

## 2020-05-27 DIAGNOSIS — E7800 Pure hypercholesterolemia, unspecified: Secondary | ICD-10-CM

## 2020-05-27 DIAGNOSIS — J449 Chronic obstructive pulmonary disease, unspecified: Secondary | ICD-10-CM

## 2020-05-27 DIAGNOSIS — E039 Hypothyroidism, unspecified: Secondary | ICD-10-CM

## 2020-05-27 DIAGNOSIS — I358 Other nonrheumatic aortic valve disorders: Secondary | ICD-10-CM

## 2020-05-27 DIAGNOSIS — I208 Other forms of angina pectoris: Secondary | ICD-10-CM

## 2020-05-27 NOTE — Progress Notes
Date of Service: 05/27/2020    Robin Carr is a 79 y.o. female.       HPI     Robin Carr returns for follow-up of her hypertension with her history of cardiac syndrome X marginally inactive over the past several years.  She is 79 years old with history of chest pain and normal Regadenoson Thallium thallium scan in 2015.  She took amlodipine for several years but since I have seen her last in October 2020 she stopped the amlodipine and has only had rare episodes of mild chest pain that have not been significant enough for her to even consider taking a nitroglycerin.  Knee pain limits her activities.  She lives alone and takes care of her house and dogs and cats that she keeps.  She is active with her grandchildren as her daughter lives fairly close by at [place].    She has some other health issues that are more problematic this year than when I saw her last year.  Her renal function has been labile.  Her EGFR is dropped from the 50s to her reading in October 2021 of 38.  She tells me that follow-up lab by her primary care physician Dr. Fredia Sorrow shows her renal function has improved a bit.  She has another lab panel planned for January.  Decision about needing kidney ultrasound has is still pending.    She is also had some abnormalities suggesting precancerous polyps and is scheduled to have another colonoscopy in the near future.  She is never had atrial fibrillation and is not on an anticoagulant for any other reason.  Her blood pressures when checked are very similar to what she has now which is the 130/84.  She is taking lisinopril and chlorthalidone with no hypotensive symptoms.    She takes atorvastatin for hypercholesterolemia.  Her last lipid profile from April 24, 2020 looks favorable with total 157 Triggs 126 HDL 45 and LDL 87.  She has had the LDL readings between 73 and 87 on the same dose.       Vitals:    05/27/20 0947   BP: 130/84   BP Source: Arm, Left Upper   Patient Position: Sitting Pulse: 61   SpO2: 98%   Weight: 72 kg (158 lb 12.8 oz)   Height: 1.473 m (4' 10)   PainSc: Zero     Body mass index is 33.19 kg/m?Marland Kitchen     Past Medical History  Patient Active Problem List    Diagnosis Date Noted   ? 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 COPD (chronic obstructive pulmonary disease) (HCC) 05/14/2014   ? Hypothyroid 05/14/2014   ? Hyperlipemia 05/14/2014     On mevacor 10 05/20/2014 FLP: total 182, trig 87, HDL 65, LDL 108    08/10/2017 total cholesterol 154, triglyceride 147, HDL 53, LDL 73 on Lipitor 20 without myalgias         ? Hypertension 05/14/2014   ? 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   06/2014: regadenoson thallium: EF 86%, normal perfusion  08/2015 Occasional chest pain mostlyw/ physical stress when emotionally stressed, nitro responsive, stable  10/2016 off amlodipine due to edema patient having no exertional  chest pain with nitroglycerin at home but not used recently.     ? 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   HENT: Negative.    Eyes: Negative.    Cardiovascular: Negative.    Respiratory: Positive for shortness of breath.    Endocrine: Negative.    Hematologic/Lymphatic: Negative.    Skin: Negative.    Musculoskeletal: Negative.    Gastrointestinal: Negative.    Genitourinary: Negative.    Neurological: Positive for loss of balance and vertigo.   Psychiatric/Behavioral: Negative.    Allergic/Immunologic: Negative.    14 organ system review noted. It is negative except as reported in current narrative or above in the ROS section. This is a patient centered review of systems that was stated by the patient in her terms prior to my personal problem oriented interview with the patient     Physical Exam  General: Patient in no distress, looks generally healthy. Skin warm and dry, non icteric, ?  Mucous membranes moist. ?Pupils equal and round ?Carotids: no bruits ???Thyroid not enlarged.  Neck veins: CVP <6 ?no V wave, no HJR ??  Respiratory: Breathing comfortably. Lungs clear to percussion. and auscultation  Cardiac: Regular rhythm. LV impulse not palpable. Normal S1 &?S2, Fourth heart sound, no rub or S3. ?Fourth heart sound with no rub or third sound . ?Grade i-ii/vi mid peaking systolic murmur with no diastolic murmur.  Abdomen: soft, non-tender, no masses,bruits,hepatic or aortic enlargement. + bowel sounds.   Femoral arteries: Good pulses, no bruits. ?Legs/feet: Normal PT pulses, ?2-3 mm bilateral ankle and low to mid leg edema slightly greater on the right   Motor: Normal muscle strength. Cognitive: Pleasant demeanor. Good insight. 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      Problems Addressed Today  No diagnosis found.    Assessment and Plan     Robin Carr is doing very well.  Her blood pressures are at the upper range of normal.  I do not believe there is reason to augment antihypertensive therapy at this time.    Her lipid profile looks good.  She has never had proven heart disease or had stroke or repeat coronary revascularization so the LDL between 70 and 100 is satisfactory.    She is having no meaningful degree of chest pain suggesting angina.  There is no reason to restart her on amlodipine.    Her exercise tolerance is limited by her knee pain.  I think she is doing as well as she can and has good quality of life.    Her issues related to colonoscopy and her renal insufficiency ER more compelling right now than anything related to her heart.  Her heart murmur suggests only mild to moderate aortic sclerosis or stenosis.  She has no symptoms and no findings suggesting severe aortic stenosis.  I do not think she needs an echo Doppler in the  very near future nor could I project when she needs another echo for follow-up.    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  I do not believe she needs another one in the near future just because it has been a while since her last 1.    30 minutes were expended as the total time for this encounter.  The time was spent reviewing records,  interviewing patient, doing exam, developing diagnosis, creating treatment plan written in patient oriented  terminology  for the AVS, explaining it to the patient and entering further information in the EMR.        NB: The free text in this document was generated through Dragon(TM) software with editing and proofreading  done by the author of this document Dr. Mable Paris MD, [facility] principally at the point of care. Some errors may persist.  If there are questions about content in this document please contact Dr. Hale Bogus.    The written information I provided Robin Carr at the conclusion of today's encounter is as  follows:  Marland Kitchen  Patient Instructions   Overall I think your heart status is stable and does not need any special treatment changes today.    Your chest pain has not flared up.  If you do start to need chest pain because of you are getting it when you walk let us know since any acceleration of the pain could mean we need to look into it.    Your blood pressure control looks good.  You are right at 130/80 which is the high reading ice reading we want to see.  If it starts creeping upward let us know and we can help you with that   although Dr. Alona Bene could also change your medications.    You have heart murmur suggests a little stiffening of the aortic valve.  This valve lets blood out of your heart.  We have not checked the echo in several years but 1year not having exertional chest pain or new shortness of breath and your exam seems the same with no suggestion of increasingly worsened valve narrowing doing another echo is just not needed.    Your cholesterol control looks good.  You do not need severe reduction in LDL since you have never had any real complications from elevated cholesterol.    Your exercise program is about as good as it can be given your knee pain.    No doctor visit is complete without having the doctor tell you should lose weight.  You do not need to cut back calories drastically.  If you can just cut 300 cal out of your diet a day you lose about 15 pounds a year.  300 to 400 cal is about the calories and a large muffin.    Lets have you set up for an appointment with [place] in a year.  I am not sure what Dr. Reesa Chew will have appear when you want to be seen but we can deal with that closer to the time of your visit.             Current Medications (including today's revisions)  ? aclidinium bromide(+) (TUDORZA PRESSAIR) 400 mcg/actuation inhaler   1 Puff(s), BID, INH, 3 EA, 340b plan  replaces spiriva, 3 Number of Refills, 3, Route to Pharmacy Electronically, [facility]   ? albuterol (PROAIR HFA) 90 mcg/actuation inhaler Inhale 2 Puffs by mouth every 6 hours as needed for  Wheezing.   ? albuterol 0.5% (PROVENTIL; VENTOLIN) 2.5 mg/0.5 mL nebu nebulizer solution Inhale 2.5 mg solution as directed every 6 hours as needed.   ? allopurinol (ZYLOPRIM) 100 mg tablet Take 100 mg by mouth twice daily.   ? aspirin EC 81 mg tablet Take 81 mg by mouth daily. Take with food.   ? atorvastatin (LIPITOR) 20 mg tablet Take 1 tablet by mouth once daily   ? calcium carbonate/vitamin D2 (CALCIUM + VITAMIN D PO) Take  by mouth.   ? chlorthalidone (HYGROTON) 25 mg tablet Take 1 tablet by mouth once daily   ? Cholecalciferol (Vitamin D3) 2,000 unit cap Take 1 tablet by mouth daily.   ? FLUTICASONE/SALMETEROL (ADVAIR DISKUS IN) Inhale  by mouth into the lungs twice daily.   ? levothyroxine (SYNTHROID) 75 mcg tablet Take 75 mcg by mouth daily.   ? lisinopriL (ZESTRIL) 10 mg tablet TAKE 1 TABLET BY MOUTH ONCE DAILY AT BEDTIME   ? loratadine (CLARITIN) 10 mg tablet Take 10 mg by mouth daily.   ? meclizine (ANTIVERT) 25 mg tablet Take 25 mg by mouth as Needed.   ? MULTIVITAMINS WITH FLUORIDE (MULTI-VITAMIN PO) Take  by mouth.   ? nitroglycerin (NITROSTAT) 0.3 mg tablet Place 1 Tab under tongue every 5 minutes as needed for Chest Pain.   ? omeprazole DR (PRILOSEC) 40 mg capsule Take 40 mg by mouth daily before breakfast.   ? ranitidine(+) (ZANTAC) 150 mg tablet Take 150 mg by mouth at bedtime daily.   ? tiotropium (SPIRIVA WITH HANDIHALER) 18 mcg capsule for inhaler Place 18 mcg into inhaler and inhale into lungs as directed daily.

## 2020-05-27 NOTE — Patient Instructions
Overall I think your heart status is stable and does not need any special treatment changes today.    Your chest pain has not flared up.  If you do start to need chest pain because of you are getting it when you walk let us know since any acceleration of the pain could mean we need to look into it.    Your blood pressure control looks good.  You are right at 130/80 which is the high reading ice reading we want to see.  If it starts creeping upward let us know and we can help you with that   although Dr. Alona Bene could also change your medications.    You have heart murmur suggests a little stiffening of the aortic valve.  This valve lets blood out of your heart.  We have not checked the echo in several years but 1year not having exertional chest pain or new shortness of breath and your exam seems the same with no suggestion of increasingly worsened valve narrowing doing another echo is just not needed.    Your cholesterol control looks good.  You do not need severe reduction in LDL since you have never had any real complications from elevated cholesterol.    Your exercise program is about as good as it can be given your knee pain.    No doctor visit is complete without having the doctor tell you should lose weight.  You do not need to cut back calories drastically.  If you can just cut 300 cal out of your diet a day you lose about 15 pounds a year.  300 to 400 cal is about the calories and a large muffin.    Lets have you set up for an appointment with [place] in a year.  I am not sure what Dr. Reesa Chew will have appear when you want to be seen but we can deal with that closer to the time of your visit.

## 2020-06-16 ENCOUNTER — Encounter: Admit: 2020-06-16 | Discharge: 2020-06-16 | Payer: MEDICARE

## 2020-06-16 MED ORDER — LISINOPRIL 10 MG PO TAB
ORAL_TABLET | Freq: Every day | 3 refills | Status: AC
Start: 2020-06-16 — End: ?

## 2020-06-16 MED ORDER — ATORVASTATIN 20 MG PO TAB
ORAL_TABLET | Freq: Every day | 3 refills | Status: AC
Start: 2020-06-16 — End: ?

## 2021-06-03 ENCOUNTER — Encounter: Admit: 2021-06-03 | Discharge: 2021-06-03 | Payer: MEDICARE

## 2021-06-03 DIAGNOSIS — E7800 Pure hypercholesterolemia, unspecified: Secondary | ICD-10-CM

## 2021-06-03 DIAGNOSIS — I10 Essential (primary) hypertension: Secondary | ICD-10-CM

## 2021-06-03 MED ORDER — LISINOPRIL 10 MG PO TAB
ORAL_TABLET | Freq: Every day | 0 refills | Status: AC
Start: 2021-06-03 — End: ?

## 2021-06-03 MED ORDER — ATORVASTATIN 20 MG PO TAB
ORAL_TABLET | Freq: Every day | 0 refills | Status: AC
Start: 2021-06-03 — End: ?

## 2021-06-03 NOTE — Telephone Encounter
Due for labs.  Mailed labs to pt's home address.  Faxed to [facility] hospital also.

## 2021-06-15 ENCOUNTER — Encounter: Admit: 2021-06-15 | Discharge: 2021-06-15 | Payer: MEDICARE

## 2021-06-18 ENCOUNTER — Encounter: Admit: 2021-06-18 | Discharge: 2021-06-18 | Payer: MEDICARE

## 2021-06-18 DIAGNOSIS — E7800 Pure hypercholesterolemia, unspecified: Secondary | ICD-10-CM

## 2021-06-18 DIAGNOSIS — I358 Other nonrheumatic aortic valve disorders: Secondary | ICD-10-CM

## 2021-06-18 DIAGNOSIS — I10 Essential (primary) hypertension: Secondary | ICD-10-CM

## 2021-06-18 DIAGNOSIS — J449 Chronic obstructive pulmonary disease, unspecified: Secondary | ICD-10-CM

## 2021-06-18 DIAGNOSIS — I208 Other forms of angina pectoris: Secondary | ICD-10-CM

## 2021-06-18 DIAGNOSIS — E039 Hypothyroidism, unspecified: Secondary | ICD-10-CM

## 2021-06-18 DIAGNOSIS — N289 Disorder of kidney and ureter, unspecified: Secondary | ICD-10-CM

## 2021-06-18 DIAGNOSIS — E785 Hyperlipidemia, unspecified: Secondary | ICD-10-CM

## 2021-06-18 DIAGNOSIS — I509 Heart failure, unspecified: Secondary | ICD-10-CM

## 2021-06-18 LAB — LIPID PROFILE
CHOLESTEROL/HDL %: 3
CHOLESTEROL: 141
HDL: 42 — ABNORMAL HIGH (ref 0.57–1.11)
LDL: 80
TRIGLYCERIDES: 96
VLDL: 19

## 2021-06-18 LAB — COMPREHENSIVE METABOLIC PANEL
ALBUMIN: 3.8
ALK PHOSPHATASE: 81
ALT: 19
AST: 19
CHLORIDE: 107
POTASSIUM: 4.5
SODIUM: 142

## 2021-06-18 NOTE — Assessment & Plan Note
She does not check her blood pressure at home but says that her blood pressure at Dr. Lorrin Mais office has always been okay.

## 2021-06-18 NOTE — Assessment & Plan Note
It's her understanding that renal function is OK.

## 2021-06-18 NOTE — Assessment & Plan Note
Her late peaking systolic murmur suggests that her aortic stenosis has progressed somewhat.  Its been about 5 years since she had an echocardiogram so I ordered a follow-up study to be done within the next few weeks.

## 2021-06-18 NOTE — Progress Notes
Date of Service: 06/18/2021    Robin Carr is a 80 y.o. female.       HPI     Robin Carr was in the [facility] today for follow-up regarding vasospastic angina.  Her symptoms are fairly quiescent, only having one or two episodes/year now.  Rarely she takes a SL NTG tablet.    She has some progression in exertional dyspnea over the past year.  She occasionally gets some light headedness when climbing stairs or hurrying.  She's attributed these symptoms to her COPD.    It sounds like she has benign positional vertigo and gets a little bit of dizziness if she moves her head too quickly.  She denies any palpitations or syncope.  She has had no TIA or stroke symptoms.         Vitals:    06/18/21 1241   BP: (!) 148/80   BP Source: Arm, Left Upper   Pulse: 57   SpO2: 98%   O2 Device: None (Room air)   PainSc: Four   Weight: 67.6 kg (149 lb)   Height: 144.8 cm (4' 9)     Body mass index is 32.24 kg/m?Marland Kitchen     Past Medical History  Patient Active Problem List    Diagnosis Date Noted   ? 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 COPD (chronic obstructive pulmonary disease) (HCC) 05/14/2014   ? Hypothyroid 05/14/2014   ? Hyperlipemia 05/14/2014     On mevacor 10 05/20/2014 FLP: total 182, trig 87, HDL 65, LDL 108    08/10/2017 total cholesterol 154, triglyceride 147, HDL 53, LDL 73 on Lipitor 20 without myalgias         ? Hypertension 05/14/2014   ? 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   06/2014: regadenoson thallium: EF 86%, normal perfusion  08/2015 Occasional chest pain mostlyw/ physical stress when emotionally stressed, nitro responsive, stable  10/2016 off amlodipine due to edema patient having no exertional chest pain with nitroglycerin at home but not used recently.     ? 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   HENT: Negative.    Eyes: Negative.    Cardiovascular: Negative.    Respiratory: Negative.    Endocrine: Negative.    Hematologic/Lymphatic: Negative.    Skin: Negative.    Musculoskeletal: Negative.    Gastrointestinal: Negative.    Genitourinary: Negative.    Neurological: Negative.    Psychiatric/Behavioral: Negative.    Allergic/Immunologic: Negative.        Physical Exam    Physical Exam   General Appearance: no distress   Skin: warm, no ulcers or xanthomas   Digits and Nails: no cyanosis or clubbing   Eyes: conjunctivae and lids normal, pupils are equal and round   Teeth/Gums/Palate: dentition unremarkable, no lesions   Lips & Oral Mucosa: no pallor or cyanosis   Neck  Veins: normal JVP , neck veins are not distended   Thyroid: no nodules, masses, tenderness or enlargement   Chest Inspection: chest is normal in appearance   Respiratory Effort: breathing comfortably, no respiratory distress   Auscultation/Percussion: lungs clear to auscultation, no rales or rhonchi, no wheezing   PMI: PMI not enlarged or displaced   Cardiac Rhythm: regular rhythm and normal rate   Cardiac Auscultation: S1, S2 normal, no rub, no gallop   Murmurs: grade 2 systolic murmur   Peripheral Circulation: normal peripheral circulation   Carotid Arteries: normal carotid upstroke bilaterally, no bruits   Radial Arteries: normal symmetric radial pulses   Abdominal Aorta: no abdominal aortic bruit   Pedal Pulses: normal symmetric pedal pulses   Lower Extremity Edema: no lower extremity edema   Abdominal Exam: soft, non-tender, no masses, bowel sounds normal   Liver & Spleen: no organomegaly   Gait & Station: walks without assistance   Muscle Strength: normal muscle tone   Orientation: oriented to time, place and person   Affect & Mood: appropriate and sustained affect   Language and Memory: patient responsive and seems to comprehend information   Neurologic Exam: neurological assessment grossly intact   Other: moves all extremities        Cardiovascular Health Factors  Vitals BP Readings from Last 3 Encounters:   06/18/21 (!) 148/80   05/27/20 130/84   05/01/19 132/78     Wt Readings from Last 3 Encounters:   06/18/21 67.6 kg (149 lb)   05/27/20 72 kg (158 lb 12.8 oz)   05/01/19 71.7 kg (158 lb)     BMI Readings from Last 3 Encounters:   06/18/21 32.24 kg/m?   05/27/20 33.19 kg/m?   05/01/19 33.02 kg/m?      Smoking Social History     Tobacco Use   Smoking Status Former   Smokeless Tobacco Never      Lipid Profile Cholesterol   Date Value Ref Range Status   06/18/2021 141  Final     HDL   Date Value Ref Range Status   06/18/2021 42  Final     LDL   Date Value Ref Range Status   06/18/2021 80  Final     Triglycerides   Date Value Ref Range Status   06/18/2021 96  Final      Blood Sugar No results found for: HGBA1C  Glucose   Date Value Ref Range Status   06/18/2021 104  Final   08/04/2020 99  Final   04/24/2020 113 (H)  Final          Problems Addressed Today  Encounter Diagnoses   Name Primary?   ? Primary hypertension Yes   ? Cardiac syndrome X (HCC)    ? Renal insufficiency    ? Pure hypercholesterolemia    ? Aortic valve sclerosis        Assessment and Plan       Renal insufficiency  It's her understanding that renal function is OK.    Hyperlipemia  Lab Results   Component Value Date    CHOL 141 06/18/2021    TRIG 96 06/18/2021    HDL 42 06/18/2021    LDL 80 06/18/2021    VLDL 19 06/18/2021    CHOLHDLC 3 06/18/2021      LDL has been in this ballpark for years.    Aortic valve sclerosis  Her late peaking systolic murmur suggests that her aortic stenosis has progressed somewhat.  Its  been about 5 years since she had an echocardiogram so I ordered a follow-up study to be done within the next few weeks.    Hypertension  She does not check her blood pressure at home but says that her blood pressure at Dr. Lorrin Mais office has always been okay.      Current Medications (including today's revisions)    Total time spent on today's office visit was 30 minutes.  This includes face-to-face in person visit with patient as well as nonface-to-face time including review of the EMR, outside records, labs, radiologic studies, echocardiogram & other cardiovascular studies, formation of treatment plan, after visit summary, future disposition, and lastly on documentation.

## 2021-06-18 NOTE — Assessment & Plan Note
Lab Results   Component Value Date    CHOL 141 06/18/2021    TRIG 96 06/18/2021    HDL 42 06/18/2021    LDL 80 06/18/2021    VLDL 19 06/18/2021    CHOLHDLC 3 06/18/2021      LDL has been in this ballpark for years.

## 2021-06-22 ENCOUNTER — Ambulatory Visit: Admit: 2021-06-22 | Discharge: 2021-06-22 | Payer: MEDICARE

## 2021-06-22 ENCOUNTER — Encounter: Admit: 2021-06-22 | Discharge: 2021-06-22 | Payer: MEDICARE

## 2021-06-22 DIAGNOSIS — I10 Essential (primary) hypertension: Secondary | ICD-10-CM

## 2021-06-22 DIAGNOSIS — I208 Other forms of angina pectoris: Secondary | ICD-10-CM

## 2021-06-23 ENCOUNTER — Encounter: Admit: 2021-06-23 | Discharge: 2021-06-23 | Payer: MEDICARE

## 2021-06-23 NOTE — Telephone Encounter
-----   Message from Connye Burkitt, RN sent at 06/23/2021  4:34 PM CST -----    ----- Message -----  From: Vanice Sarah, MD  Sent: 06/23/2021   4:30 PM CST  To: Nicole Cella, MD, Connye Burkitt, RN    [facility], can you please let her know that I think the echo looks OK for her.  Minimal progression in aortic stenosis over past 4 years.  We should plan echo-Doppler in 2 years.  Thanks.    Cc:  Dr. Alona Bene

## 2021-06-23 NOTE — Telephone Encounter
Results and recommendations called to patient.

## 2021-09-09 ENCOUNTER — Encounter: Admit: 2021-09-09 | Discharge: 2021-09-09 | Payer: MEDICARE

## 2021-09-09 MED ORDER — CHLORTHALIDONE 25 MG PO TAB
ORAL_TABLET | 3 refills | Status: AC
Start: 2021-09-09 — End: ?

## 2021-09-27 ENCOUNTER — Encounter: Admit: 2021-09-27 | Discharge: 2021-09-27 | Payer: MEDICARE

## 2021-09-27 MED ORDER — LISINOPRIL 10 MG PO TAB
ORAL_TABLET | 0 refills
Start: 2021-09-27 — End: ?

## 2021-09-27 MED ORDER — ATORVASTATIN 20 MG PO TAB
ORAL_TABLET | 0 refills
Start: 2021-09-27 — End: ?

## 2022-04-05 ENCOUNTER — Encounter: Admit: 2022-04-05 | Discharge: 2022-04-05 | Payer: MEDICARE

## 2022-04-05 NOTE — Telephone Encounter
Received a fax from Dr. Zack Seal requesting clearance for removal of a ganglion cyst from her wrist. Patient is currently on aspirin 81 mg daily, they did not request to hold.    Will route to Dr. Ricard Dillon for his review and recommendations of clearance and how long she could hold it aspirin if they do request for her to hold it.

## 2022-07-08 ENCOUNTER — Encounter: Admit: 2022-07-08 | Discharge: 2022-07-08 | Payer: MEDICARE

## 2022-09-08 ENCOUNTER — Encounter: Admit: 2022-09-08 | Discharge: 2022-09-08 | Payer: MEDICARE

## 2022-09-08 MED ORDER — ATORVASTATIN 20 MG PO TAB
20 mg | ORAL_TABLET | Freq: Every day | ORAL | 0 refills | Status: AC
Start: 2022-09-08 — End: ?

## 2022-10-26 ENCOUNTER — Encounter: Admit: 2022-10-26 | Discharge: 2022-10-26 | Payer: MEDICARE

## 2022-11-01 ENCOUNTER — Encounter: Admit: 2022-11-01 | Discharge: 2022-11-01 | Payer: MEDICARE

## 2022-11-01 NOTE — Assessment & Plan Note
Lab Results   Component Value Date    CHOL 163 06/07/2022    TRIG 56 06/07/2022    HDL 66 06/07/2022    LDL 86 06/07/2022    VLDL 11 06/07/2022    CHOLHDLC 2 06/07/2022

## 2022-11-02 ENCOUNTER — Encounter: Admit: 2022-11-02 | Discharge: 2022-11-02 | Payer: MEDICARE

## 2022-11-02 DIAGNOSIS — J449 Chronic obstructive pulmonary disease, unspecified: Secondary | ICD-10-CM

## 2022-11-02 DIAGNOSIS — E7800 Pure hypercholesterolemia, unspecified: Secondary | ICD-10-CM

## 2022-11-02 DIAGNOSIS — I2089 Cardiac syndrome X (HCC): Secondary | ICD-10-CM

## 2022-11-02 DIAGNOSIS — I509 Heart failure, unspecified: Secondary | ICD-10-CM

## 2022-11-02 DIAGNOSIS — I10 Essential (primary) hypertension: Secondary | ICD-10-CM

## 2022-11-02 DIAGNOSIS — E785 Hyperlipidemia, unspecified: Secondary | ICD-10-CM

## 2022-11-02 DIAGNOSIS — E039 Hypothyroidism, unspecified: Secondary | ICD-10-CM

## 2022-11-02 DIAGNOSIS — I350 Nonrheumatic aortic (valve) stenosis: Secondary | ICD-10-CM

## 2022-11-02 NOTE — Assessment & Plan Note
Her symptoms are quite stable with very rare use of SL NTG.

## 2022-11-02 NOTE — Progress Notes
Date of Service: 11/02/2022    Robin Carr is a 82 y.o. female.       HPI     Robin Carr was in the [place] clinic today for follow-up regarding vasospastic angina.  Her symptoms are fairly quiescent, only having one or two episodes/year now.  Rarely she takes a SL NTG tablet.     She has some progression in exertional dyspnea over the past year.  She occasionally gets some light headedness when climbing stairs or hurrying.  She's attributed these symptoms to her COPD.  She sees Dr. Doran Heater regularly and is currently on antibiotics for an exacerbation          Vitals:    11/02/22 1608   BP: 135/82   BP Source: Arm, Left Upper   Pulse: 86   SpO2: 96%   O2 Device: None (Room air)   PainSc: Three   Weight: 67.3 kg (148 lb 6.4 oz)   Height: 142.2 cm (4' 8)     Body mass index is 33.27 kg/m?Marland Kitchen     Past Medical History  Patient Active Problem List    Diagnosis Date Noted    Hyperplastic colonic polyp 10/25/2017     Approximately December 2018 colonoscopy with multiple nonmalignant but precancerous polyps with a follow-up colonoscopy recommended for 2023      COPD (chronic obstructive pulmonary disease) (HCC) 05/14/2014    Hypothyroid 05/14/2014    Hyperlipemia 05/14/2014     On mevacor 10 05/20/2014 FLP: total 182, trig 87, HDL 65, LDL 108    08/10/2017 total cholesterol 154, triglyceride 147, HDL 53, LDL 73 on Lipitor 20 without myalgias          Hypertension 05/14/2014    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   06/2014: regadenoson thallium: EF 86%, normal perfusion  08/2015 Occasional chest pain mostlyw/ physical stress when emotionally stressed, nitro responsive, stable  10/2016 off amlodipine due to edema patient having no exertional chest pain with nitroglycerin at home but not used recently.      Nonrheumatic aortic valve stenosis 05/14/2014     10/2007 Echo shows aortic sclerosis no stenosis, Normal left ventricular systolic function   05/2014 echo-Doppler  Normal left ventricular EF, Aortic sclerosis, Mean gradient 16, peak velocity 2.7 m/sed, valve area estimate 1.38 cm2               Review of Systems   Constitutional: Positive for malaise/fatigue.   HENT:  Positive for tinnitus.    Eyes: Negative.    Cardiovascular:  Positive for chest pain, dyspnea on exertion and leg swelling.   Respiratory:  Positive for shortness of breath.    Endocrine: Negative.    Hematologic/Lymphatic: Negative.    Skin: Negative.    Musculoskeletal:  Positive for back pain, joint pain, muscle weakness, myalgias and neck pain.   Gastrointestinal: Negative.    Genitourinary: Negative.    Neurological:  Positive for weakness.   Psychiatric/Behavioral: Negative.     Allergic/Immunologic: Negative.        Physical Exam    Physical Exam   General Appearance: no distress   Skin: warm, no ulcers or xanthomas   Digits and Nails: no cyanosis or clubbing   Eyes: conjunctivae and lids normal, pupils are equal and round   Teeth/Gums/Palate: dentition unremarkable, no lesions   Lips &  Oral Mucosa: no pallor or cyanosis   Neck Veins: normal JVP , neck veins are not distended   Thyroid: no nodules, masses, tenderness or enlargement   Chest Inspection: chest is normal in appearance   Respiratory Effort: breathing comfortably, no respiratory distress   Auscultation/Percussion: lungs clear to auscultation, no rales or rhonchi, no wheezing   PMI: PMI not enlarged or displaced   Cardiac Rhythm: regular rhythm and normal rate   Cardiac Auscultation: S1, S2 normal, no rub, no gallop   Murmurs: no murmur   Peripheral Circulation: normal peripheral circulation   Carotid Arteries: normal carotid upstroke bilaterally, no bruits   Radial Arteries: normal symmetric radial pulses   Abdominal Aorta: no abdominal aortic bruit   Pedal Pulses: normal symmetric pedal pulses   Lower Extremity Edema: no lower extremity edema   Abdominal Exam: soft, non-tender, no masses, bowel sounds normal   Liver & Spleen: no organomegaly   Gait & Station: walks without assistance   Muscle Strength: normal muscle tone   Orientation: oriented to time, place and person   Affect & Mood: appropriate and sustained affect   Language and Memory: patient responsive and seems to comprehend information   Neurologic Exam: neurological assessment grossly intact   Other: moves all extremities      Cardiovascular Health Factors  Vitals BP Readings from Last 3 Encounters:   11/02/22 135/82   06/22/21 130/70   06/18/21 (!) 148/80     Wt Readings from Last 3 Encounters:   11/02/22 67.3 kg (148 lb 6.4 oz)   06/22/21 69 kg (152 lb 1.9 oz)   06/18/21 67.6 kg (149 lb)     BMI Readings from Last 3 Encounters:   11/02/22 33.27 kg/m?   06/22/21 34.10 kg/m?   06/18/21 32.24 kg/m?      Smoking Social History     Tobacco Use   Smoking Status Former    Types: Cigarettes   Smokeless Tobacco Never      Lipid Profile Cholesterol   Date Value Ref Range Status   06/07/2022 163  Final     HDL   Date Value Ref Range Status   06/07/2022 66  Final     LDL   Date Value Ref Range Status   06/07/2022 86  Final     Triglycerides   Date Value Ref Range Status   06/07/2022 56  Final      Blood Sugar No results found for: HGBA1C  Glucose   Date Value Ref Range Status   06/07/2022 100  Final   06/18/2021 104  Final   08/04/2020 99  Final          Problems Addressed Today  Encounter Diagnoses   Name Primary?    Pure hypercholesterolemia Yes    Nonrheumatic aortic valve stenosis     Cardiac syndrome X (HCC)        Assessment and Plan       Hyperlipemia  Lab Results   Component Value Date    CHOL 163 06/07/2022    TRIG 56 06/07/2022    HDL 66 06/07/2022    LDL 86 06/07/2022    VLDL 11 06/07/2022    CHOLHDLC 2 06/07/2022          Nonrheumatic aortic valve stenosis  I will order an echocardiogram when I see her back in a year to re-assess her mild aortic stenosis.    Cardiac syndrome X (HCC)  Her symptoms are quite stable with very rare  use of SL NTG.    Current Medications (including today's revisions)   aclidinium bromide(+) (TUDORZA PRESSAIR) 400 mcg/actuation inhaler   1 Puff(s), BID, INH, 3 EA, 340b plan  replaces spiriva, 3 Number of Refills, 3, Route to Pharmacy Electronically, [facility]    albuterol 0.5% (PROVENTIL; VENTOLIN) 2.5 mg/0.5 mL nebu nebulizer solution Inhale one each solution by nebulizer as directed every 6 hours as needed.    albuterol sulfate (PROAIR HFA) 90 mcg/actuation HFA aerosol inhaler Inhale two puffs by mouth into the lungs every 6 hours as needed for Wheezing.    allopurinol (ZYLOPRIM) 100 mg tablet Take one tablet by mouth twice daily.    aspirin EC 81 mg tablet Take one tablet by mouth daily. Take with food.    atorvastatin (LIPITOR) 20 mg tablet Take 1 tablet by mouth once daily    chlorthalidone (HYGROTON) 25 mg tablet Take 1 tablet by mouth once daily (Patient taking differently: Take one-half tablet by mouth daily.)    Cholecalciferol (Vitamin D3) 2,000 unit cap Take one capsule by mouth daily.    citalopram (CELEXA) 10 mg tablet Take one tablet by mouth daily.    doxycycline monohydrate (MONODOX) 100 mg capsule Take one capsule by mouth twice daily.    fluticasone-salmeterol (ADVAIR DISKUS) 250-50 mcg/dose inhalation disk Inhale one puff by mouth into the lungs twice daily.    levothyroxine (SYNTHROID) 75 mcg tablet Take one tablet by mouth daily.    lisinopril (ZESTRIL) 5 mg tablet Take one tablet by mouth at bedtime daily.    loratadine (CLARITIN) 10 mg tablet Take one tablet by mouth daily.    meclizine (ANTIVERT) 25 mg tablet Take one tablet by mouth as Needed.    MULTIVITAMINS WITH FLUORIDE (MULTI-VITAMIN PO) Take  by mouth daily.    nitroglycerin (NITROSTAT) 0.3 mg tablet Place 1 Tab under tongue every 5 minutes as needed for Chest Pain.    omeprazole DR (PRILOSEC) 20 mg capsule Take one capsule by mouth daily before breakfast.    predniSONE (DELTASONE) 10 mg tablet Take one tablet by mouth. Tapered dose    tiotropium bromide (SPIRIVA) 18 mcg capsule for inhaler Place one capsule into inhaler and inhale into lungs as directed daily.     Total time spent on today's office visit was 30 minutes.  This includes face-to-face in person visit with patient as well as nonface-to-face time including review of the EMR, outside records, labs, radiologic studies, echocardiogram & other cardiovascular studies, formation of treatment plan, after visit summary, future disposition, and lastly on documentation.

## 2022-11-02 NOTE — Assessment & Plan Note
I will order an echocardiogram when I see her back in a year to re-assess her mild aortic stenosis.

## 2022-12-18 ENCOUNTER — Encounter: Admit: 2022-12-18 | Discharge: 2022-12-18 | Payer: MEDICARE

## 2022-12-18 MED ORDER — ATORVASTATIN 20 MG PO TAB
20 mg | ORAL_TABLET | Freq: Every day | ORAL | 0 refills
Start: 2022-12-18 — End: ?

## 2023-03-09 ENCOUNTER — Encounter: Admit: 2023-03-09 | Discharge: 2023-03-09 | Payer: MEDICARE

## 2023-07-29 ENCOUNTER — Encounter: Admit: 2023-07-29 | Discharge: 2023-07-29 | Payer: MEDICARE

## 2023-09-30 ENCOUNTER — Encounter: Admit: 2023-09-30 | Discharge: 2023-09-30

## 2023-10-13 ENCOUNTER — Encounter: Admit: 2023-10-13 | Discharge: 2023-10-13

## 2023-10-13 ENCOUNTER — Ambulatory Visit: Admit: 2023-10-13 | Discharge: 2023-10-13

## 2024-01-25 ENCOUNTER — Encounter: Admit: 2024-01-25 | Discharge: 2024-01-25 | Payer: MEDICARE

## 2024-01-26 ENCOUNTER — Encounter: Admit: 2024-01-26 | Discharge: 2024-01-26 | Payer: MEDICARE

## 2024-01-26 DIAGNOSIS — I350 Nonrheumatic aortic (valve) stenosis: Secondary | ICD-10-CM

## 2024-01-26 DIAGNOSIS — E7800 Pure hypercholesterolemia, unspecified: Secondary | ICD-10-CM

## 2024-01-26 DIAGNOSIS — I201 Angina pectoris with documented spasm: Principal | ICD-10-CM

## 2024-01-26 DIAGNOSIS — I10 Essential (primary) hypertension: Secondary | ICD-10-CM

## 2024-01-26 MED ORDER — NITROGLYCERIN 0.4 MG SL SUBL
ORAL_TABLET | SUBLINGUAL | 3 refills | 9.00000 days | Status: AC
Start: 2024-01-26 — End: ?

## 2024-01-26 NOTE — Assessment & Plan Note
 Lab Results   Component Value Date    CHOL 163 06/07/2022    TRIG 56 06/07/2022    HDL 66 06/07/2022    LDL 86 06/07/2022    VLDL 11 06/07/2022    CHOLHDLC 2 06/07/2022      Atorva 20/d

## 2024-01-26 NOTE — Assessment & Plan Note
 Lisinopril  5/dy, chlorthalidone  25/d.

## 2024-01-26 NOTE — Assessment & Plan Note
 Last echo 10/2023:  EF 65%.  AV peak velocity is 3.3 m/s, the peak gradient is 43 mm Horii, the mean gradient is 22 mmHg, aortic valve velocity ratio 0.45. There is trace AI.     She still has moderate aortic stenosis; we'll plan repeat echo 10/2024.

## 2024-01-26 NOTE — Progress Notes
 Date of Service: 01/26/2024    Robin Carr is a 83 y.o. female.       HPI     [facility] in [facility] for follow-up regarding vasospastic angina.  Her symptoms are fairly quiescent, only having one or two episodes/year now.  Rarely she takes a SL NTG tablet.     She has some progression in exertional dyspnea over the past year.  She occasionally gets some light headedness when climbing stairs or hurrying.  She's attributed these symptoms to her COPD.  She sees Dr. Quenten regularly and is currently on antibiotics for an exacerbation.         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     Approximately December 2018 colonoscopy with multiple nonmalignant but precancerous polyps with a follow-up colonoscopy recommended for 2023      COPD (chronic obstructive pulmonary disease) (CMS-HCC) 05/14/2014    Hypothyroid 05/14/2014    Hyperlipemia 05/14/2014     On mevacor 10 05/20/2014 FLP: total 182, trig 87, HDL 65, LDL 108    08/10/2017 total cholesterol 154, triglyceride 147, HDL 53, LDL 73 on Lipitor 20 without myalgias          Hypertension 05/14/2014    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   06/2014: regadenoson thallium: EF 86%, normal perfusion  08/2015 Occasional chest pain mostlyw/ physical stress when emotionally stressed, nitro responsive, stable  10/2016 off amlodipine due to edema patient having no exertional chest pain with nitroglycerin  at home but not used recently.      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   HENT: Negative.     Eyes: Negative.    Cardiovascular: Negative.    Respiratory: Negative.     Endocrine: Negative.    Hematologic/Lymphatic: Negative.    Skin: Negative.    Musculoskeletal: Negative.    Gastrointestinal: Negative.    Genitourinary: Negative.    Neurological: Negative.    Psychiatric/Behavioral: Negative.     Allergic/Immunologic: Negative.        Physical Exam    Physical Exam   General Appearance: no distress   Skin: warm, no ulcers or xanthomas   Digits and Nails: no cyanosis or clubbing   Eyes: conjunctivae and lids normal, pupils are equal and round   Teeth/Gums/Palate: dentition unremarkable, no lesions   Lips & Oral Mucosa: no pallor or cyanosis   Neck Veins: normal JVP , neck veins are not distended   Thyroid: no nodules, masses, tenderness or enlargement   Chest Inspection:  chest is normal in appearance   Respiratory Effort: breathing comfortably, no respiratory distress   Auscultation/Percussion: lungs clear to auscultation, no rales or rhonchi, no wheezing   PMI: PMI not enlarged or displaced   Cardiac Rhythm: regular rhythm and normal rate   Cardiac Auscultation: S1, S2 normal, no rub, no gallop   Murmurs: grade 3 systolic murmur   Peripheral Circulation: normal peripheral circulation   Carotid Arteries: normal carotid upstroke bilaterally, no bruits   Radial Arteries: normal symmetric radial pulses   Abdominal Aorta: no abdominal aortic bruit   Pedal Pulses: normal symmetric pedal pulses   Lower Extremity Edema: no lower extremity edema   Abdominal Exam: soft, non-tender, no masses, bowel sounds normal   Liver & Spleen: no organomegaly   Gait & Station: walks without assistance   Muscle Strength: normal muscle tone   Orientation: oriented to time, place and person   Affect & Mood: appropriate and sustained affect   Language and Memory: patient responsive and seems to comprehend information   Neurologic Exam: neurological assessment grossly intact   Other: moves all extremities      Cardiovascular Health Factors  Vitals BP Readings from Last 3 Encounters:   01/26/24 (!) 141/72   10/13/23 120/70   11/02/22 135/82     Wt Readings from Last 3 Encounters:   01/26/24 61.1 kg (134 lb 9.6 oz)   10/13/23 58 kg (127 lb 13.9 oz)   11/02/22 67.3 kg (148 lb 6.4 oz)     BMI Readings from Last 3 Encounters:   01/26/24 29.13 kg/m?   10/13/23 27.67 kg/m?   11/02/22 33.27 kg/m?      Smoking Social History     Tobacco Use   Smoking Status Former    Types: Cigarettes   Smokeless Tobacco Never      Lipid Profile Cholesterol   Date Value Ref Range Status   06/07/2022 163  Final     HDL   Date Value Ref Range Status   06/07/2022 66  Final     LDL   Date Value Ref Range Status   06/07/2022 86  Final     Triglycerides   Date Value Ref Range Status   06/07/2022 56  Final      Blood Sugar No results found for: HGBA1C  Glucose   Date Value Ref Range Status   11/25/2023 155 (H) 70 - 105 Final   06/07/2022 100  Final   06/18/2021 104  Final          Problems Addressed Today  Encounter Diagnoses   Name Primary?    Vasospastic angina Yes    Nonrheumatic aortic valve stenosis     Primary hypertension     Pure hypercholesterolemia        Assessment and Plan       Vasospastic angina  Initial diagnosis in 2015, treated with amlodipine  06/2014 - Non-ischemic reg/thal.  Never has had any coronary imaging procedures.  2018 amlodipine DCed due to peripheral edema    Currently just prn NTG    Nonrheumatic aortic valve stenosis  Last echo 10/2023:  EF 65%.  AV peak velocity is 3.3 m/s, the peak gradient is 43 mm Horii, the mean gradient is 22 mmHg, aortic valve velocity ratio 0.45. There is trace AI.     She still has moderate aortic stenosis; we'll plan repeat echo 10/2024.    Hypertension  Lisinopril  5/dy, chlorthalidone  25/d.    Hyperlipemia  Lab Results  Component Value Date    CHOL 163 06/07/2022    TRIG 56 06/07/2022    HDL 66 06/07/2022    LDL 86 06/07/2022    VLDL 11 06/07/2022    CHOLHDLC 2 06/07/2022      Atorva 20/d    Current Medications (including today's revisions)   aclidinium bromide(+) (TUDORZA PRESSAIR) 400 mcg/actuation inhaler   1 Puff(s), BID, INH, 3 EA, 340b plan  replaces spiriva, 3 Number of Refills, 3, Route to Pharmacy Electronically, [facility] (Patient not taking: Reported on 01/26/2024)    albuterol 0.5% (PROVENTIL; VENTOLIN) 2.5 mg/0.5 mL nebu nebulizer solution Inhale one each solution by nebulizer as directed every 6 hours as needed.    albuterol sulfate (PROAIR HFA) 90 mcg/actuation HFA aerosol inhaler Inhale two puffs by mouth into the lungs every 6 hours as needed for Wheezing.    allopurinol (ZYLOPRIM) 100 mg tablet Take one tablet by mouth twice daily.    aspirin EC 81 mg tablet Take one tablet by mouth daily. Take with food.    atorvastatin  (LIPITOR) 20 mg tablet Take 1 tablet by mouth once daily (Patient taking differently: Take one-half tablet by mouth daily.)    chlorthalidone  (HYGROTON ) 25 mg tablet Take 1 tablet by mouth once daily (Patient not taking: Reported on 01/26/2024)    Cholecalciferol (Vitamin D3) 2,000 unit cap Take one capsule by mouth daily.    cholestyramine-sucrose (QUESTRAN) 4 gram packet Take one packet by mouth twice daily as needed.    citalopram (CELEXA) 10 mg tablet Take one tablet by mouth daily.    fluticasone-salmeterol (ADVAIR DISKUS) 250-50 mcg/dose inhalation disk Inhale one puff by mouth into the lungs twice daily.    L. acidophilus/L.bulgaricus (LACTOBACILLUS ACIDOPH-L.BULGAR PO) Take 1 tablet by mouth three times daily with meals.    levothyroxine (SYNTHROID) 75 mcg tablet Take one tablet by mouth daily.    lisinopril  (ZESTRIL ) 5 mg tablet Take one tablet by mouth at bedtime daily. (Patient not taking: Reported on 01/26/2024)    loratadine (CLARITIN) 10 mg tablet Take one tablet by mouth daily.    meclizine (ANTIVERT) 25 mg tablet Take one tablet by mouth as Needed.    MULTIVITAMINS WITH FLUORIDE (MULTI-VITAMIN PO) Take  by mouth daily.    nitroglycerin  (NITROSTAT ) 0.3 mg tablet Place 1 Tab under tongue every 5 minutes as needed for Chest Pain.    omeprazole DR (PRILOSEC) 20 mg capsule Take one capsule by mouth daily before breakfast.    predniSONE (DELTASONE) 10 mg tablet Take one tablet by mouth. Tapered dose (Patient not taking: Reported on 01/26/2024)    tiotropium bromide (SPIRIVA) 18 mcg capsule for inhaler Place one capsule into inhaler and inhale into lungs as directed daily. (Patient not taking: Reported on 01/26/2024)     Total time spent on today's office visit was 45 minutes.  This includes face-to-face in person visit with patient as well as nonface-to-face time including review of the EMR, outside records, labs, radiologic studies, echocardiogram & other cardiovascular studies, formation of treatment plan, after visit summary, future disposition, and lastly on documentation.

## 2024-06-26 ENCOUNTER — Encounter: Admit: 2024-06-26 | Discharge: 2024-06-26 | Payer: MEDICARE
